# Patient Record
Sex: Male | Born: 1955 | Race: White | Hispanic: No | Marital: Married | Smoking: Current every day smoker
Health system: Southern US, Community
[De-identification: ages and names within clinical notes are randomized; demographics above are authoritative.]

## PROBLEM LIST (undated history)

## (undated) DIAGNOSIS — E782 Mixed hyperlipidemia: Secondary | ICD-10-CM

## (undated) DIAGNOSIS — K219 Gastro-esophageal reflux disease without esophagitis: Secondary | ICD-10-CM

## (undated) DIAGNOSIS — J45909 Unspecified asthma, uncomplicated: Secondary | ICD-10-CM

## (undated) DIAGNOSIS — Z720 Tobacco use: Secondary | ICD-10-CM

## (undated) DIAGNOSIS — G8929 Other chronic pain: Secondary | ICD-10-CM

## (undated) DIAGNOSIS — J449 Chronic obstructive pulmonary disease, unspecified: Secondary | ICD-10-CM

## (undated) DIAGNOSIS — R011 Cardiac murmur, unspecified: Secondary | ICD-10-CM

## (undated) HISTORY — PX: BACK SURGERY: SHX140

## (undated) HISTORY — DX: Mixed hyperlipidemia: E78.2

## (undated) HISTORY — DX: Other chronic pain: G89.29

## (undated) HISTORY — DX: Cardiac murmur, unspecified: R01.1

## (undated) HISTORY — DX: Tobacco use: Z72.0

## (undated) HISTORY — DX: Gastro-esophageal reflux disease without esophagitis: K21.9

---

## 2001-09-09 ENCOUNTER — Encounter: Payer: Self-pay | Admitting: *Deleted

## 2001-09-09 ENCOUNTER — Ambulatory Visit (HOSPITAL_COMMUNITY): Admission: RE | Admit: 2001-09-09 | Discharge: 2001-09-09 | Payer: Self-pay | Admitting: *Deleted

## 2002-05-22 ENCOUNTER — Emergency Department (HOSPITAL_COMMUNITY): Admission: EM | Admit: 2002-05-22 | Discharge: 2002-05-22 | Payer: Self-pay | Admitting: Internal Medicine

## 2002-05-22 ENCOUNTER — Encounter: Payer: Self-pay | Admitting: Internal Medicine

## 2002-06-11 ENCOUNTER — Encounter: Payer: Self-pay | Admitting: Orthopaedic Surgery

## 2002-06-11 ENCOUNTER — Encounter (HOSPITAL_COMMUNITY): Admission: RE | Admit: 2002-06-11 | Discharge: 2002-07-11 | Payer: Self-pay | Admitting: Orthopaedic Surgery

## 2002-07-20 ENCOUNTER — Encounter: Payer: Self-pay | Admitting: Internal Medicine

## 2002-07-20 ENCOUNTER — Ambulatory Visit (HOSPITAL_COMMUNITY): Admission: RE | Admit: 2002-07-20 | Discharge: 2002-07-20 | Payer: Self-pay | Admitting: Internal Medicine

## 2002-09-14 ENCOUNTER — Encounter: Payer: Self-pay | Admitting: *Deleted

## 2002-09-14 ENCOUNTER — Ambulatory Visit (HOSPITAL_COMMUNITY): Admission: RE | Admit: 2002-09-14 | Discharge: 2002-09-14 | Payer: Self-pay | Admitting: *Deleted

## 2002-10-05 ENCOUNTER — Encounter: Payer: Self-pay | Admitting: *Deleted

## 2002-10-05 ENCOUNTER — Ambulatory Visit (HOSPITAL_COMMUNITY): Admission: RE | Admit: 2002-10-05 | Discharge: 2002-10-05 | Payer: Self-pay | Admitting: *Deleted

## 2002-10-08 ENCOUNTER — Inpatient Hospital Stay (HOSPITAL_COMMUNITY): Admission: RE | Admit: 2002-10-08 | Discharge: 2002-10-09 | Payer: Self-pay | Admitting: *Deleted

## 2002-10-08 ENCOUNTER — Encounter: Payer: Self-pay | Admitting: *Deleted

## 2002-11-22 ENCOUNTER — Ambulatory Visit (HOSPITAL_COMMUNITY): Admission: RE | Admit: 2002-11-22 | Discharge: 2002-11-22 | Payer: Self-pay | Admitting: Orthopedic Surgery

## 2002-11-22 ENCOUNTER — Encounter: Payer: Self-pay | Admitting: Orthopedic Surgery

## 2003-03-02 ENCOUNTER — Ambulatory Visit (HOSPITAL_COMMUNITY): Admission: RE | Admit: 2003-03-02 | Discharge: 2003-03-02 | Payer: Self-pay | Admitting: *Deleted

## 2003-05-11 ENCOUNTER — Ambulatory Visit (HOSPITAL_COMMUNITY): Admission: RE | Admit: 2003-05-11 | Discharge: 2003-05-11 | Payer: Self-pay | Admitting: *Deleted

## 2003-06-02 ENCOUNTER — Encounter: Admission: RE | Admit: 2003-06-02 | Discharge: 2003-06-02 | Payer: Self-pay | Admitting: *Deleted

## 2003-06-15 ENCOUNTER — Ambulatory Visit (HOSPITAL_COMMUNITY): Admission: RE | Admit: 2003-06-15 | Discharge: 2003-06-15 | Payer: Self-pay | Admitting: *Deleted

## 2004-02-10 ENCOUNTER — Ambulatory Visit (HOSPITAL_COMMUNITY): Admission: RE | Admit: 2004-02-10 | Discharge: 2004-02-10 | Payer: Self-pay | Admitting: Internal Medicine

## 2004-02-21 ENCOUNTER — Ambulatory Visit: Payer: Self-pay | Admitting: Physician Assistant

## 2004-02-28 ENCOUNTER — Ambulatory Visit: Payer: Self-pay | Admitting: Pain Medicine

## 2004-03-15 ENCOUNTER — Emergency Department: Payer: Self-pay | Admitting: Emergency Medicine

## 2004-03-15 ENCOUNTER — Ambulatory Visit: Payer: Self-pay | Admitting: Physician Assistant

## 2004-04-04 ENCOUNTER — Ambulatory Visit (HOSPITAL_COMMUNITY): Payer: Self-pay | Admitting: Psychiatry

## 2004-07-06 ENCOUNTER — Emergency Department: Payer: Self-pay | Admitting: Emergency Medicine

## 2005-05-06 HISTORY — PX: ESOPHAGOGASTRODUODENOSCOPY: SHX1529

## 2006-01-03 ENCOUNTER — Ambulatory Visit (HOSPITAL_COMMUNITY): Admission: RE | Admit: 2006-01-03 | Discharge: 2006-01-03 | Payer: Self-pay | Admitting: Family Medicine

## 2006-01-10 ENCOUNTER — Ambulatory Visit (HOSPITAL_COMMUNITY): Admission: RE | Admit: 2006-01-10 | Discharge: 2006-01-10 | Payer: Self-pay | Admitting: Family Medicine

## 2006-01-14 ENCOUNTER — Ambulatory Visit (HOSPITAL_COMMUNITY): Admission: RE | Admit: 2006-01-14 | Discharge: 2006-01-14 | Payer: Self-pay | Admitting: Family Medicine

## 2006-02-26 ENCOUNTER — Ambulatory Visit: Payer: Self-pay | Admitting: Internal Medicine

## 2006-03-10 ENCOUNTER — Ambulatory Visit (HOSPITAL_COMMUNITY): Admission: RE | Admit: 2006-03-10 | Discharge: 2006-03-10 | Payer: Self-pay | Admitting: Internal Medicine

## 2006-03-10 ENCOUNTER — Ambulatory Visit: Payer: Self-pay | Admitting: Internal Medicine

## 2007-05-07 HISTORY — PX: COLONOSCOPY: SHX174

## 2007-05-29 ENCOUNTER — Ambulatory Visit (HOSPITAL_COMMUNITY): Admission: RE | Admit: 2007-05-29 | Discharge: 2007-05-29 | Payer: Self-pay | Admitting: Family Medicine

## 2007-06-29 ENCOUNTER — Ambulatory Visit (HOSPITAL_COMMUNITY): Admission: RE | Admit: 2007-06-29 | Discharge: 2007-06-29 | Payer: Self-pay | Admitting: Family Medicine

## 2008-01-28 ENCOUNTER — Ambulatory Visit (HOSPITAL_COMMUNITY): Admission: RE | Admit: 2008-01-28 | Discharge: 2008-01-28 | Payer: Self-pay | Admitting: Family Medicine

## 2008-02-04 ENCOUNTER — Ambulatory Visit (HOSPITAL_COMMUNITY): Admission: RE | Admit: 2008-02-04 | Discharge: 2008-02-04 | Payer: Self-pay | Admitting: Family Medicine

## 2008-02-10 ENCOUNTER — Ambulatory Visit (HOSPITAL_COMMUNITY): Admission: RE | Admit: 2008-02-10 | Discharge: 2008-02-10 | Payer: Self-pay | Admitting: Family Medicine

## 2008-02-23 ENCOUNTER — Ambulatory Visit (HOSPITAL_COMMUNITY): Admission: RE | Admit: 2008-02-23 | Discharge: 2008-02-23 | Payer: Self-pay | Admitting: Gastroenterology

## 2008-02-23 ENCOUNTER — Ambulatory Visit: Payer: Self-pay | Admitting: Gastroenterology

## 2008-03-30 ENCOUNTER — Ambulatory Visit (HOSPITAL_COMMUNITY): Admission: RE | Admit: 2008-03-30 | Discharge: 2008-03-30 | Payer: Self-pay | Admitting: Internal Medicine

## 2008-08-01 ENCOUNTER — Emergency Department (HOSPITAL_COMMUNITY): Admission: EM | Admit: 2008-08-01 | Discharge: 2008-08-01 | Payer: Self-pay | Admitting: Emergency Medicine

## 2010-05-28 ENCOUNTER — Encounter: Payer: Self-pay | Admitting: Internal Medicine

## 2010-08-16 LAB — COMPREHENSIVE METABOLIC PANEL
ALT: 38 U/L (ref 0–53)
Albumin: 4.5 g/dL (ref 3.5–5.2)
Alkaline Phosphatase: 172 U/L — ABNORMAL HIGH (ref 39–117)
Chloride: 102 mEq/L (ref 96–112)
Glucose, Bld: 156 mg/dL — ABNORMAL HIGH (ref 70–99)
Potassium: 4.1 mEq/L (ref 3.5–5.1)
Sodium: 138 mEq/L (ref 135–145)
Total Bilirubin: 0.6 mg/dL (ref 0.3–1.2)
Total Protein: 8 g/dL (ref 6.0–8.3)

## 2010-08-16 LAB — DIFFERENTIAL
Eosinophils Absolute: 0.2 10*3/uL (ref 0.0–0.7)
Eosinophils Relative: 2 % (ref 0–5)
Lymphs Abs: 2.1 10*3/uL (ref 0.7–4.0)
Monocytes Relative: 7 % (ref 3–12)

## 2010-08-16 LAB — URINALYSIS, ROUTINE W REFLEX MICROSCOPIC
Bilirubin Urine: NEGATIVE
Hgb urine dipstick: NEGATIVE
Nitrite: NEGATIVE
Specific Gravity, Urine: >1.03 — ABNORMAL HIGH (ref 1.005–1.030)
Urobilinogen, UA: 0.2 mg/dL (ref 0.0–1.0)

## 2010-08-16 LAB — URINE MICROSCOPIC-ADD ON

## 2010-08-16 LAB — URINE CULTURE: Colony Count: 55000

## 2010-08-16 LAB — CBC
MCHC: 33.9 g/dL (ref 30.0–36.0)
RBC: 5.93 MIL/uL — ABNORMAL HIGH (ref 4.22–5.81)
RDW: 14.2 % (ref 11.5–15.5)

## 2010-09-18 NOTE — Op Note (Signed)
NAME:  Jamie Lam, Jamie Lam               ACCOUNT NO.:  [id number]   MEDICAL RECORD NO.:  [id number]          PATIENT TYPE:  AMB   LOCATION:  DAY                           FACILITY:  [facility]   PHYSICIAN:  Kassie Mends, M.D.      DATE OF BIRTH:  1956-02-18   DATE OF PROCEDURE:  02/23/2008  DATE OF DISCHARGE:                               OPERATIVE REPORT   REFERRING Tucker Steedley:  Earma Reading PA-C   PROCEDURE:  Colonoscopy.   INDICATION FOR EXAM:  Mr. Pineo is a 55 year old male who presents for  average risk colon cancer screening.   FINDINGS:  1. Rare sigmoid colon diverticula.  Otherwise, no polyps, masses,      inflammatory changes, or AVMs seen.  2. Normal retroflexed view of the rectum.   RECOMMENDATIONS:  1. Screening colonoscopy in 10 years.  2. He should follow high-fiber diet.  He is given a handout on high-      fiber diet, constipation, and diverticulosis.   MEDICATIONS:  1. Demerol 100 mg IV.  2. Versed 5 mg IV.   PROCEDURE TECHNIQUE:  Physical exam was performed.  Informed consent was  obtained from the patient after explaining the benefits, risks, and  alternatives to the procedure.  The patient was connected to the monitor  and placed in a left lateral position.  Continuous oxygen was provided  by nasal cannula and IV medicine administered through an indwelling  cannula.  After administration of sedation and rectal exam, the  patient's rectum was intubated and scope was advanced under direct  visualization to the cecum.  The scope was removed slowly by carefully  examining the color, texture, anatomy, and integrity of the mucosa on  the way out.  The patient was recovered in endoscopy and discharged home  in satisfactory condition.      Kassie Mends, M.D.  Electronically Signed    SM/MEDQ  D:  02/23/2008  T:  02/23/2008  Job:  [id number]   cc:   Kirk Ruths, M.D.  Fax: [phone]

## 2010-09-18 NOTE — Procedures (Signed)
NAME:  NOLYN, SWAB               ACCOUNT NO.:  [id number]   MEDICAL RECORD NO.:  [id number]          PATIENT TYPE:  OUT   LOCATION:  RESP                          FACILITY:  [facility]   PHYSICIAN:  Edward L. Juanetta Gosling, M.D.DATE OF BIRTH:  1956/01/14   DATE OF PROCEDURE:  DATE OF DISCHARGE:                            PULMONARY FUNCTION TEST   Mr. Troiani is a patient of Dr. Regino Schultze.   Spirometry shows no ventilatory defect and evidence of airflow  obstruction mostly at the level of the smaller airways.   Lung volumes are normal.   Diffusing capacity of lung for carbon monoxide is normal.   Arterial blood gases are normal.      Edward L. Juanetta Gosling, M.D.  Electronically Signed     ELH/MEDQ  D:  02/11/2008  T:  02/12/2008  Job:  [id number]   cc:   Kirk Ruths, M.D.  Fax: [phone]

## 2010-09-21 NOTE — Op Note (Signed)
[facility]  Patient:    Jamie Lam, TRAUM Visit Number: [id number] MRN: [id number]          Service Type: Attending:  Donnal Debar. Gasper Sells, M.D. Dictated by:   Donnal Debar Gasper Sells, M.D. Proc. Date: 09/09/01 Adm. Date:  09/09/01                             Operative Report  PROCEDURE:  Bilateral sacroiliac joint block.  DESCRIPTION OF PROCEDURE:  With the patient in the prone position the patient was prepped and draped for a bilateral SI joint by prepping the buttocks bilaterally with Betadine.  Then 22 gauge needles were inserted down to the junction of the SI joint with the pelvis in the AP projection.  Poking these joints particularly on the left recreated this problem.  The joints were injected with a total of 3 cc. in each side in 1 cc. aliquots with aspiration before each injection of 4 cc. of 25% bupivacaine without epinephrine and 2 cc. of Depo-Medrol.  This relieved his SI joint pain and his back pain and leg pain 100% more or less almost immediately.  Based on this one could conclude the bulk of his pain is coming from his SI joints. Dictated by:   Donnal Debar Gasper Sells, M.D. Attending:  Donnal Debar. Gasper Sells, M.D. DD:  09/09/01 TD:  09/09/01 Job: [id number]

## 2010-09-21 NOTE — Consult Note (Signed)
NAME:  Jamie Lam, Jamie Lam               ACCOUNT NO.:  [id number]   MEDICAL RECORD NO.:  [id number]          PATIENT TYPE:  AMB   LOCATION:  DAY                           FACILITY:  [facility]   PHYSICIAN:  Lionel December, M.D.    DATE OF BIRTH:  1956-02-06   DATE OF CONSULTATION:  DATE OF DISCHARGE:                                   CONSULTATION   REQUESTING PHYSICIAN:  Dr. Patrica Duel.   REASON FOR CONSULTATION:  Positive H pylori/chronic nausea and vomiting.   HISTORY OF PRESENT ILLNESS:  Jamie Lam is a 55 year old Caucasian male  referred to the courtesy of Dr. Geanie Logan office.  He tells me he has had  chronic nausea and vomiting.  Eating definitely makes his symptoms worse.  He also complains of bilateral stabbing-type abdominal pain into his upper  abdomen.  It usually lasts about for 1-4 hours when he gets pain.  He rates  the pain 6/10 on pain scale.  There is no particular time a day that he gets  a pain.  He states he has not felt well since January when he was sick due  to consuming peanut butter.  He has had abdominal bloating since that time.  He also complains of fatigue.  He is having 4-5 bowel movements a day.  Denies any diarrhea, constipation.  Denies any rectal bleeding or melena.  Denies any heartburn or indigestion.  Denies any early satiety. but does  have anorexia.  He is only consuming one to two meals a day because he is  afraid of having abdominal pain and bloating.  He denies any dysphagia or  odynophagia.  He had an abdominal ultrasound on January 10, 2006.  There  was mild extrahepatic biliary dilatation for his age.  Therefore, this was  followed by a CT with and without contrast.  The hepatobiliary system  appeared normal on CT.  There were few small renal calculi which were  nonobstructive, and atherosclerotic disease, but no aneurysm.  He had a  negative hepatitis B surface antigen and core antibody, hepatitis A antibody  and C antibody.  He also had an  elevated white blood cell count of 13.7,  normal hemoglobin, hematocrit and a platelet count of 432.  He had an  elevated alkaline phosphatase of 187 and elevated glucose of 130, otherwise,  normal LFTs and comprehensive metabolic panel.  He had an amylase of 19,  lipase of 14 which are normal, and he was found to be H.  Pylori positive at  3.4.  He does not believe he has been treated for this yet.   PAST MEDICAL HISTORY:  1. Asthma.  2. Chronic back pain and surgery in 2004.  3. Depression.   CURRENT MEDICATIONS:  1. Lunesta q.h.s.  2. Cymbalta two p.o. at q.a.m.  3. Gabapentin 4 daily.  4. Skelaxin 2-3 daily.  5. tramadol q. 4-6 h p.r.n.  6. Vitamin B12 once daily.  7. He takes mini ephedrine three times a day p.r.n.   ALLERGIES:  NO KNOWN DRUG ALLERGIES.   FAMILY HISTORY:  There is no known  family history colorectal carcinoma,  liver or chronic GI problems.  Father deceased at age 55 secondary lymphoma.  Mother age 25, has history of asthma.  He has three healthy siblings.   SOCIAL HISTORY:  Mr. Goodall has been married for 19 years.  He has one grown  healthy child.  He is disabled.  He has a 32 pack year history of tobacco  use.  He has a heavy alcohol use history for about 8 years.  He has not  consumed any alcohol in the last 9 years.  Denies any drug use.   REVIEW OF SYSTEMS:  CONSTITUTIONAL:  He has complained of some low grade  temperature as well as chills.  He complains of fatigue.  CARDIOVASCULAR:  Denies any chest pain or palpitation.  PULMONARY:  No shortness of breath,  dyspnea, cough or hemoptysis.  GI:  See HPI.   PHYSICAL EXAMINATION:  VITAL SIGNS:  Weight 147 pounds, height 67 inches,  temperature 98.1, blood pressure 118/68 and pulse 92.  GENERAL:  Jamie Lam a 55 year old thin Caucasian male who is alert and  pleasant, cooperative in no acute distress.  HEENT:  Scler are clear.  Nonicteric.  Conjunctivae are pink.  Oropharynx  pink, moist without  lesions.  NECK:  Supple without mass or thyromegaly.  CHEST:  Regular rate and rhythm.  Normal S1, S2.  Without murmurs, rubs,  clicks or gallops.  LUNGS:  Clear to auscultation bilaterally, but he does have decreased breath  sounds bilaterally.  ABDOMEN:  Positive bowel sounds x4.  No bruits auscultated.  Soft,  nontender, nondistended without palpable mass, or hepatosplenomegaly.  No  rebound tenderness or guarding.  EXTREMITIES:  Without clubbing or edema bilaterally.  SKIN:  Pink, warm and dry without any rash or jaundice.   ASSESSMENT:  Jamie Lam is a 55 year old Caucasian male with a 57-month  history of chronic nausea and vomiting along with upper abdominal pain which  generally lasts between 1-4 hours with each episode.  Pain is usually worse  postprandially.  He is also having anorexia and only consumes about 1-2  meals per day.  He was recently found to be H.  Pylori positive.  He also  complains of significant bloating and fatigue.  He is going to require  further evaluation of his upper GI tract to rule out peptic ulcer disease  and H. Pylori gastritis.   PLAN:  1. EGD with Dr. Karilyn Cota in the near future.  I have discussed this      procedure including risks and benefits including but not limited to      bleeding, infection, perforation, drug reaction.  He agrees to plan and      consent will be obtained.  I would like to thank Dr. Nobie Putnam for      allowing [place] to participate in the care of Mr. Oregel.      Nicholas Lose, N.P.      Lionel December, M.D.  Electronically Signed    KC/MEDQ  D:  02/26/2006  T:  02/27/2006  Job:  [id number]   cc:   Patrica Duel, M.D.  Fax: [phone]

## 2010-09-21 NOTE — Op Note (Signed)
NAME:  Jamie Lam, Jamie Lam                         ACCOUNT NO.:  [id number]   MEDICAL RECORD NO.:  [id number]                   PATIENT TYPE:  OUT   LOCATION:  XRAY                                 FACILITY:  [facility]   PHYSICIAN:  Ricky D. Gasper Sells, M.D.             DATE OF BIRTH:  03-02-1956   DATE OF PROCEDURE:  DATE OF DISCHARGE:                                 OPERATIVE REPORT   PREOPERATIVE DIAGNOSES:  1. Bilateral sacroiliac joint-related pain.  2. Herniated nucleus pulposus/degenerative disk disease, lumbar 3-4, well-     documented.   POSTOPERATIVE DIAGNOSES:  1. Bilateral sacroiliac joint-related pain.  2. Herniated nucleus pulposus/degenerative disk disease, lumbar 3-4, well-     documented.   OPERATION PERFORMED:  Left sacroiliac joint injection with local anesthetic  and steroids under fluoroscopic control.   SURGEON:  Ricky D. Gasper Sells, M.D.   SUMMARY:  This is a 56 year old gentleman I saw in my office a year ago with  SI joint-related pain.  He had a grossly abnormal MRI, but that was the  source of his discomfort.  I took him to [facility] and injected  both his SI joints under fluoroscopic control, and that knocked his pain out  for eight hours.  I was under the understanding that the [facility] in  [place] did SI joint radiofrequency coagulation to find they did not.  He  went back to them for treatment, they were not able to do anything, so he  came back to see Dr. Newell Coral.  Dr. Jule Ser evaluated him, find out that he  had seen me and sent him back to me.   Essentially his picture is identically the same.  His SI joint examination  was grossly abnormal, moreso on the right than the left.  His straight-leg  raises were negative.  His femoral nerve stretches were negative.  His hip  examinations were negative and neurologically he was intact.  Based on this  I decided to block his SI joints.   DESCRIPTION OF PROCEDURE:  With the patient in the  prone position the SI  joints were prepped with Betadine solution.  Starting out on the left side a  22-gauge needle was inserted down to the junction of the SI joint and the  cavity of the pelvis in the AP projection.  Poking that joint recreated his  usual left-sided discomfort.  The joint was  then blocked with a total of  3 mL of a solution of 2 mL of 25% bupivacaine  and 1 mL of Depo-Medrol in half milliliter aliquot of aspiration for each  injection.  This completely relieved his left-sided discomfort.   The patient tolerated the procedure well.  Ricky D. Gasper Sells, M.D.    RDH/MEDQ  D:  09/14/2002  T:  09/14/2002  Job:  [id number]   cc:   Hewitt Shorts, M.D.  [address]  [place]  [place] [address]  Fax: [phone]

## 2010-09-21 NOTE — Op Note (Signed)
NAME:  Jamie Lam, Jamie Lam               ACCOUNT NO.:  [id number]   MEDICAL RECORD NO.:  [id number]          PATIENT TYPE:  AMB   LOCATION:  DAY                           FACILITY:  [facility]   PHYSICIAN:  Lionel December, M.D.    DATE OF BIRTH:  1955-06-25   DATE OF PROCEDURE:  03/10/2006  DATE OF DISCHARGE:                                 OPERATIVE REPORT   PROCEDURE:  Esophagogastroduodenoscopy.   INDICATIONS:  Jamie Lam is a 55 year old Caucasian male with history of chronic  back pain, depression, and asthma, who has been experiencing nausea and  vomiting of 10 months' duration with intermittent upper abdominal pain.  On  workup, he was noted to have mildly elevated alkaline phosphatase and mildly  dilated CBD.  However, CT was negative.  His Helicobacter pylori serology is  positive, but treatment has been put on hold because of his symptomatology,  and he is to be treated.   Procedure risks were reviewed with the patient. Informed consent was  obtained.   MEDICATIONS FOR CONSCIOUS SEDATION:  1. Benzocaine spray for pharyngeal topical anesthesia.  2. Demerol 50 mg IV.  3. Versed 8 mg IV.   FINDINGS:  Procedure performed in endoscopy suite.  The patient's vital  signs and O2 saturation were monitored during the procedure and remained  stable.  The patient was placed in the left lateral recumbent position.  The  Olympus videoscope was passed through the oropharynx without any difficulty  into the esophagus.   Esophagus:  Mucosa of the esophagus normal.  GE junction was wavy, located  at 40 cm from the incisors.  No hernia was noted.   Stomach:  It was empty and distended very well with insufflation.  Folds of  the proximal stomach were normal.  Examination of mucosa revealed erythema,  some mucosal granularity at the antrum, but no erosions or ulcers are noted.  Pyloric channel was patent.  Angularis, fundus, and cardia examined by  retroflexing the scope and were normal.   Duodenum:   Bulbar mucosa was normal.  The scope was passed into the second  part of the duodenum, where mucosa and folds were normal.  Endoscope was  withdrawn.  The patient tolerated the procedure well.   FINAL DIAGNOSIS:  Nonerosive antral gastritis secondary to Helicobacter  pylori infection.  No evidence of peptic ulcer disease or any finding that  would explain the patient's symptomatology.   PLAN:  1. LFTs will be repeated today.  2. We will attempt to treat his Helicobacter pylori infection with a      Prevpac although we may have to stop therapy      should he develop side effects or more nausea and vomiting.  3. If the patient's AP remains elevated, ultrasound would be repeated to      make sure dilation of CBD is not progressive.  He may eventually also      need gastric emptying study.      Lionel December, M.D.  Electronically Signed     NR/MEDQ  D:  03/10/2006  T:  03/11/2006  Job:  [id number]  cc:   Patrica Duel, M.D.  Fax: [phone]

## 2010-09-21 NOTE — Op Note (Signed)
NAME:  Jamie Lam, Jamie Lam                         ACCOUNT NO.:  [id number]   MEDICAL RECORD NO.:  [id number]                   PATIENT TYPE:  OUT   LOCATION:  XRAY                                 FACILITY:  [facility]   PHYSICIAN:  Ricky D. Gasper Sells, M.D.             DATE OF BIRTH:  [birth year]   DATE OF PROCEDURE:  09/14/2002  DATE OF DISCHARGE:                                 OPERATIVE REPORT   PROCEDURE:  Right sacroiliac joint injection using a 22 gauge needle under  fluoroscopic control.   SURGEON:  Ricky D. Gasper Sells, M.D.   ASSISTANT:  None.   COMPLICATIONS:  Nil.   SUMMARY:  This is a 55 year old gentleman with well-documented SI joint  dysfunction/pain.  He had MRI's x2 which showed severe degenerative disk  disease and HNP.  Unfortunately, or fortunately, that is not the source of  his problems.  His SI joint provocative tests were all positive, especially  on the right side. His neurological was unremarkable, straight leg raising  was negative, femoral nerve stretch was negative, strength in his lower  extremities was negative.  He had had a successful SI joint in the past. He  saw Dr. Newell Coral and then Dr. Newell Coral referred him back to me, after he  heard the story.   DESCRIPTION OF PROCEDURE:  With the patient in the prone position, the  patient's buttocks were prepped with Betadine solution. The right SI joint  was identified on AP fluoroscopy and a 22 gauge needle was inserted down to  the junction of the SI joint with the pelvis.  Poking this joint recreated  his discomfort.  The joint was then anesthetized with  a total of 3 cc of a solution of 2 cc of 0.5% bupivacaine and 1 cc of Depo-  Medrol.  This was done in 0.5 cc aliquots with aspiration after each  injection.  This completely knocked out his right-sided discomfort.   The patient was then discharged home after being measured for an Aspen LSO  brace, with the SI joint present, by Mr. Carole Civil.  He will be  braced,  placed on a special physio-program for SI joints, which I am working on  with an M.D. chiropractor, and hopefully, we will be able to give these  people relief. The next step in the process is to do radiofrequency  coagulation of these joints. Hopefully, that will prove useful in this  difficult to manage problem.                                                 Ricky D. Gasper Sells, M.D.    RDH/MEDQ  D:  09/14/2002  T:  09/14/2002  Job:  [id number]   cc:   Hewitt Shorts, M.D.  [address]  [place]  [place] [address]  Fax: [phone]

## 2010-09-21 NOTE — Op Note (Signed)
NAME:  Jamie Lam, Jamie Lam                         ACCOUNT NO.:  [id number]   MEDICAL RECORD NO.:  [id number]                   PATIENT TYPE:  INP   LOCATION:  3011                                 FACILITY:  [facility]   PHYSICIAN:  Ricky D. Gasper Sells, M.D.             DATE OF BIRTH:  1955-05-17   DATE OF PROCEDURE:  DATE OF DISCHARGE:                                 OPERATIVE REPORT   PREOPERATIVE DIAGNOSES:  1. 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  2. Sacroiliac joint related pain right sided.   POSTOPERATIVE DIAGNOSES:  1. 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  2. Sacroiliac joint related pain right sided.   OPERATION:  1. Bilateral microsurgical L3-4 lumbar laminectomy with decompression of the     L4 nerve roots bilaterally and diskectomies.  2. Harvesting of bone from the laminectomy for later Autograft fusion.  3. Right posterior lateral fusion using Allometric mixed with Autograft as     previously described from the laminectomies and bone marrow mixed with     blood.   SURGEON:  Ricky D. Gasper Sells, M.D.   ASSISTANT:  Annie Sable, M.D.   ANESTHESIA:  General controlled   COMPLICATIONS:  Nil   ESTIMATED BLOOD LOSS:  Less than 200 cc   INDICATIONS FOR PROCEDURE:  This is a 55 year old male with a year and a  half history of low back pain and right greater than left leg pain related  to activity.  I saw him about a year prior to this operation and at that  time, injected his right sacroiliac joint.  He had obtained modest relief  with this and went to a pain clinic rather than undergoing surgery.  They  treated him with epidural steroids and conservatively for the next year and  eventually he was unable to go on.  I did a myelogram on him earlier this  week and it showed multifactorial stenosis at L3-4 very severe with  inconsequential bulges at other levels.  At the  L3-4 level, there was a  ruptured disc concentric to the right mixed with lateral recessed stenosis,  facet hypertrophy and thickened ligamentum flavum into a congenitally small  canal.  A flexion and extension view was done at the time of the myelogram  and there was no demonstrable instability or abnormal movement.   It was elected to do a simple decompression on him and at the time of  surgery to decide if he needs any sort of buttressing.   This plays into what in [place] is a controversy and what in the rest of the  world is not and that is who gets a fusion and what sort of fusion do they  get.  Standard treatment, but that is not to say standard care, because it  is not a standard, is to instrument and fuse  practically everybody that  comes through the door.   The logic behind this is a certain percentage of patients with this sort of  problem go on to develop instability later on.  The classic number quoted is  20%; however, it is probably lower with the keyhole type laminotomies that  are done today.  These are figures that date from the 92s and 60s.   Usual in [place], which probably relates to remuneration more than anything,  is that an instrumented fusion of some type is done usually a T-Lift or  posterior lumbar interbody fusion with pedical screws.   Statistically there is a higher fusion rate with these cases, there is no  question but the complication rate if higher and the infection rate is 600%  of the instrumented cases in posterior fusions and that is a conservative  estimate.   An intermediate step and this has been suggested a number of times by Lesle Chris who is the dean of spinal neurosurgery in [place], more or less, is  that one simply takes the bone that is removed at the time of the  laminectomy and place it back laterally in hopes of a fibrous fusion or some  buttressing down the line decreasing the chance of the much more morbid  instrumented  operation.   The alternative is to simply do the laminectomy and let them go, that is  also quite acceptable.   The former is what was carried out in this man's case without difficulty or  complication.  The patient tolerated the procedure well and awoke from  surgery without his usual left leg pain and certainly overall improved from  the preoperative condition.   Intraoperatively and eccentric to the right subligamentous disc herniation  was found in addition to thickened ligamentum flavum, hypertrophied facets  and tight lateral recesses.  All of these were decompressed.   DESCRIPTION OF PROCEDURE:  With the patient in the prone position, the  patient was prepped and draped in the usual fashion for a lumbar  laminectomy.  All pressure points were carefully inspected and padded.  The  patient was placed in PAS hose and a bear hugger blanket and at that point,  a midline incision was made centered over what appeared to be L3-4 level.  The level was confirmed with an intraoperative control x-ray.  Incision was  adjusted 1 cm superiorly to accommodate the necessary exposure.  Paraspinal  muscles and ligaments were dissected free of the spinous process and lamina  of L3 and L4 bilaterally.  Carrying the dissection lateral to the facets of  L3-4 bilaterally out to the transverse process of the L3 and L4 on the right  side.  Self retaining retractors were placed.   Using a Leksell instrument, generous laminotomies of L3 were carried out  inferiorly bilaterally and the bone was saved for later fusion.  A high  speed drill was used to trim up the inferior laminotomy of L3 and perform a  medial facetectomy of L3-4 bilaterally since that bone was useless for  fusion anyway and because of the presence of cartilage.   Starting out on the left side, a 2 mm Kerrison punch was used to trim up the medial facetectomy of L3-4 and then the ligamentum flavum was removed in a  step-wise fashion  revealing the underlying dura and the L4 nerve root.  Nerve hook was used to dissect this free of the underlying bulging L3-4 disc  and then dura and  L4 nerve root were retracted medially which revealed the  L3-4 disc.  This was incised in a trapezoidal fashion with a #15 blade  knife.  The disc was then removed with straight biting, upbiting and  downbiting pituitary rongeurs.  Curetted with the medium bent ring curet as  well as an Epstein curet and re-evacuated with the same instruments.  Epidural hemostasis was achieved with bipolar coagulation and when that  failed, Gelfoam under gentle pressure.   The epidural space was palpated and found to be clear on the left side and  the foramen of L4 on the left was found to be well opened as well as the  lateral recess.   Exactly the same procedure was carried out on the right side.  The disc was  somewhat more bulging on the right and there was more subligamentous  component.  This was removed without difficulty in exactly the same fashion  as on the left side.   Attention was then directed lateral to the L3-4 facet on the right side.  The L3 transverse process as well as the L4 was dissected free of his  muscular ligamentous attachments and then a high speed drill was used to  decorticate the transverse process as well as the lateral aspect of the L3-4  facet joint and then a purse of the patient's own bone from the laminotomy  was mixed with Allometric powder, 3.2 cc of Vancomycin solution from the  perioperative antibiotic and a mixture of the patient's own bone marrow and  blood.  This turned into a nice firm paste.  It was then wrapped into a  purse of Surgicel and this was placed laterally basically to hold it in  place.   The self retaining retractors were then removed and the wound was irrigated  with Betadine solution, then hydrogen peroxide solution.   Control x-ray at that time showed a retained sponge in the paraspinal   muscles.  This was removed uneventfully and finally at time of closure the  instrument count, sponge count and needle count was correct.   The dorsal lumbar fascia was closed with 2-0 Vicryl in interrupted fashion.  Then 5 cc of Vancomycin solution was squirted into the subfascial space.  The subcutaneous closure was carried out with 2-0 Vicryl inverted  interrupted.  Skin staples were used in the skin which had been anesthetized  with a total of 10 cc of 0.5% Bupivacaine without epinephrine.  Betadine  ointment and dry dressing was applied.  The patient left the operating room  in good condition, moving all four extremities and was placed in his Aspen  brace in the recovery room.   No intraoperative complications occurred.  Instrument count, sponge count  and needle count was correct.                                                 Ricky D. Gasper Sells, M.D.   RDH/MEDQ  D:  10/09/2002  T:  10/09/2002  Job:  [id number]   cc:   Dept of Neurosurgery, [facility]  [address]  [address]  [place] [address]

## 2010-09-21 NOTE — Discharge Summary (Signed)
NAME:  Jamie Lam, Jamie Lam                         ACCOUNT NO.:  [id number]   MEDICAL RECORD NO.:  [id number]                   PATIENT TYPE:  INP   LOCATION:  3011                                 FACILITY:  [facility]   PHYSICIAN:  Ricky D. Gasper Sells, M.D.             DATE OF BIRTH:  09-Oct-1955   DATE OF ADMISSION:  10/08/2002  DATE OF DISCHARGE:  10/09/2002                                 DISCHARGE SUMMARY   ADMISSION DIAGNOSES:  1. Spinal stenosis L3-4 bilateral without evidence of instability.  2. Right sacroiliac joint dysfunction.   DISCHARGE DIAGNOSES:  1. Spinal stenosis L3-4 bilateral without evidence of instability.  2. Right sacroiliac joint dysfunction.   SURGERY:  1. Bilateral L3-4 microlaminectomies, decompression, and diskectomies.  2. Harvesting of bone from same incision from the laminas for later fusion.  3. Right posterolateral fusion using AlloMatrix bone marrow and blood mixed     with autograft and antibiotic solution.   SURGEON:  Ricky D. Gasper Sells, M.D.   ASSISTANT:  Romie Jumper, M.D.   COMPLICATIONS:  Nil.   ESTIMATED BLOOD LOSS:  Less than 200 mL.   SUMMARY:  This is a 55 year old male that presented with bilateral L3-4  myelographic-proven spinal stenosis with no evidence of translocation on  flexion and extension views.  He had failed approximately a year of  conservative therapy.  His presentation this time was of severe pain  unresponsive to SI joint block, incapacitating related to activity.  Myelogram proved the severe spinal stenosis at the L3-4 level.  For that  reason he was booked for surgery.  He underwent a bilateral L3-4  laminectomy.  The bone that had been taken from the laminectomy was just  placed laterally at the L3-4 level.  The rationale behind this is that if it  fuses, fine; if it develops a fibrous union to buttress the level, even  better.  Certainly, this does not fit any internationally acceptable  criteria for instrumentation.   On the other hand, there is a percentage of  these patients that go on to develop instability.  Hopefully, this minor  addition to this procedure will prevent that or decrease the likelihood of  that.   The patient tolerated the procedure well.  He had some incisional pain  immediately postoperatively.  He was up walking normally in the hall,  voiding.  He was in his brace.  The only concern with him postoperatively is  that he left the ward without doctor's orders to go smoke a cigarette.  The  understanding was that he would quit before this operation.   A long talk was had with the patient about the dangers of smoking  postoperatively.  He claimed that he is going to do his best to not do that.  We will simply have to see.   At the time of discharge vital signs are stable.  His incision looked clear.  His  motor sensory examination of the lower extremities was normal.  He was  discharged home.  He was given Lorcet 10 on a p.r.n. basis for pain.  He was  given Flexeril 10 mg p.o. three times daily for a two-week time.  He will be  seen in my office in 10 days' time.  His condition at discharge was markedly  improved.   DISCHARGE DIAGNOSES:  Same.                                               Ricky D. Gasper Sells, M.D.    RDH/MEDQ  D:  10/09/2002  T:  10/10/2002  Job:  [id number]

## 2011-02-04 LAB — BLOOD GAS, ARTERIAL
Acid-base deficit: 3.8 — ABNORMAL HIGH
Bicarbonate: 20.7
TCO2: 18.3
pCO2 arterial: 37.9
pO2, Arterial: 93.4

## 2012-04-14 ENCOUNTER — Other Ambulatory Visit (HOSPITAL_COMMUNITY): Payer: Self-pay | Admitting: Internal Medicine

## 2012-04-14 DIAGNOSIS — R7401 Elevation of levels of liver transaminase levels: Secondary | ICD-10-CM

## 2012-04-16 ENCOUNTER — Ambulatory Visit (HOSPITAL_COMMUNITY)
Admission: RE | Admit: 2012-04-16 | Discharge: 2012-04-16 | Disposition: A | Discharge status: Home / Self Care | Payer: Medicare Other | Source: Ambulatory Visit | Attending: Internal Medicine | Admitting: Internal Medicine

## 2012-04-16 DIAGNOSIS — N200 Calculus of kidney: Secondary | ICD-10-CM | POA: Insufficient documentation

## 2012-04-16 DIAGNOSIS — R7401 Elevation of levels of liver transaminase levels: Secondary | ICD-10-CM

## 2013-07-03 ENCOUNTER — Emergency Department (HOSPITAL_COMMUNITY)
Admission: EM | Admit: 2013-07-03 | Discharge: 2013-07-03 | Disposition: A | Discharge status: Home / Self Care | Payer: Medicare Other | Attending: Emergency Medicine | Admitting: Emergency Medicine

## 2013-07-03 ENCOUNTER — Emergency Department (HOSPITAL_COMMUNITY): Payer: Medicare Other

## 2013-07-03 ENCOUNTER — Encounter (HOSPITAL_COMMUNITY): Payer: Self-pay | Admitting: Emergency Medicine

## 2013-07-03 DIAGNOSIS — Y929 Unspecified place or not applicable: Secondary | ICD-10-CM | POA: Insufficient documentation

## 2013-07-03 DIAGNOSIS — Y9389 Activity, other specified: Secondary | ICD-10-CM | POA: Insufficient documentation

## 2013-07-03 DIAGNOSIS — S20219A Contusion of unspecified front wall of thorax, initial encounter: Secondary | ICD-10-CM

## 2013-07-03 DIAGNOSIS — J4489 Other specified chronic obstructive pulmonary disease: Secondary | ICD-10-CM | POA: Insufficient documentation

## 2013-07-03 DIAGNOSIS — Z79899 Other long term (current) drug therapy: Secondary | ICD-10-CM | POA: Insufficient documentation

## 2013-07-03 DIAGNOSIS — J449 Chronic obstructive pulmonary disease, unspecified: Secondary | ICD-10-CM | POA: Insufficient documentation

## 2013-07-03 DIAGNOSIS — F17200 Nicotine dependence, unspecified, uncomplicated: Secondary | ICD-10-CM | POA: Insufficient documentation

## 2013-07-03 DIAGNOSIS — W1809XA Striking against other object with subsequent fall, initial encounter: Secondary | ICD-10-CM | POA: Insufficient documentation

## 2013-07-03 HISTORY — DX: Unspecified asthma, uncomplicated: J45.909

## 2013-07-03 HISTORY — DX: Chronic obstructive pulmonary disease, unspecified: J44.9

## 2013-07-03 MED ORDER — IBUPROFEN 800 MG PO TABS: 800.0000 mg | ORAL_TABLET | Freq: Three times a day (TID) | ORAL | Status: DC

## 2013-07-03 NOTE — ED Provider Notes (Signed)
Medical screening examination/treatment/procedure(s) were performed by non-physician practitioner and as supervising physician I was immediately available for consultation/collaboration.   EKG Interpretation None        Hayzlee Mcsorley L Rebacca Votaw, MD 07/03/13 2330 

## 2013-07-03 NOTE — ED Provider Notes (Signed)
CSN: [id number]     Arrival date & time 07/03/13  2016 History   First MD Initiated Contact with Patient 07/03/13 2048     Chief Complaint  Patient presents with  . Fall  . rib pain      (Consider location/radiation/quality/duration/timing/severity/associated sxs/prior Treatment) Patient is a 58 y.o. male presenting with fall. The history is provided by the patient. No language interpreter was used.  Fall This is a new problem. The current episode started yesterday. Associated symptoms include chest pain. Pertinent negatives include no abdominal pain, coughing, fever, headaches, neck pain or vomiting. Associated symptoms comments: He was standing on an unstable object cleaning the windshield of his car yesterday when he fell, hitting his left chest on the hood of the car. He complains of pain in the left lateral chest since that time. No SOB, cough, fever. No N, V..    Past Medical History  Diagnosis Date  . Asthma   . COPD (chronic obstructive pulmonary disease)    Past Surgical History  Procedure Laterality Date  . Back surgery     History reviewed. No pertinent family history. History  Substance Use Topics  . Smoking status: Smoker, Current Status Unknown -- 1.50 packs/day    Types: Cigarettes  . Smokeless tobacco: Not on file  . Alcohol Use: Yes     Comment: ocassional beer    Review of Systems  Constitutional: Negative for fever.  Respiratory: Negative for cough and shortness of breath.   Cardiovascular: Positive for chest pain.  Gastrointestinal: Negative for vomiting and abdominal pain.  Musculoskeletal: Negative for back pain and neck pain.  Neurological: Negative for headaches.      Allergies  Abilify and Azithromycin  Home Medications   Current Outpatient Rx  Name  Route  Sig  Dispense  Refill  . albuterol (PROVENTIL) 2 MG tablet   Oral   Take 2 mg by mouth 4 (four) times daily.         Marland Kitchen. gabapentin (NEURONTIN) 800 MG tablet   Oral   Take 1  tablet by mouth 4 (four) times daily.         Marland Kitchen. HYDROcodone-acetaminophen (NORCO) 10-325 MG per tablet   Oral   Take 1 tablet by mouth every 6 (six) hours as needed. pain         . morphine (AVINZA) 60 MG 24 hr capsule   Oral   Take 60 mg by mouth daily as needed. pain         . omeprazole (PRILOSEC) 40 MG capsule   Oral   Take 40 mg by mouth daily.         . traMADol (ULTRAM) 50 MG tablet   Oral   Take 100 mg by mouth every 6 (six) hours as needed. pain          BP 131/86  Pulse 92  Temp(Src) 98.2 F (36.8 C) (Oral)  Resp 16  Ht 5\' 8"  (1.727 m)  Wt 135 lb (61.236 kg)  BMI 20.53 kg/m2  SpO2 94% Physical Exam  Constitutional: He is oriented to person, place, and time. He appears well-developed and well-nourished. No distress.  HENT:  Head: Atraumatic.  Neck: Normal range of motion.  Pulmonary/Chest: Effort normal and breath sounds normal. He exhibits tenderness.  Left lateral chest wall tenderness without bruising or swelling.   Abdominal: Soft. There is no tenderness.  Musculoskeletal: Normal range of motion.  Neurological: He is alert and oriented to person, place, and time.  Skin: Skin is warm and dry.  Psychiatric: He has a normal mood and affect.    ED Course  Procedures (including critical care time) Labs Review Labs Reviewed - No data to display Imaging Review Dg Chest 2 View  07/03/2013   CLINICAL DATA:  Left anterior chest pain.  Fall.  EXAM: CHEST  2 VIEW  COMPARISON:  01/28/2008  FINDINGS: Cardiac silhouette is normal in size. Normal mediastinal and hilar contours. The lungs are clear. No pleural effusion or pneumothorax.  The bony thorax is intact.  IMPRESSION: No active cardiopulmonary disease.   Electronically Signed   By: Amie Portland M.D.   On: 07/03/2013 21:06  Dg Chest 2 View  07/03/2013   CLINICAL DATA:  Left anterior chest pain.  Fall.  EXAM: CHEST  2 VIEW  COMPARISON:  01/28/2008  FINDINGS: Cardiac silhouette is normal in size. Normal  mediastinal and hilar contours. The lungs are clear. No pleural effusion or pneumothorax.  The bony thorax is intact.  IMPRESSION: No active cardiopulmonary disease.   Electronically Signed   By: Amie Portland M.D.   On: 07/03/2013 21:06     EKG Interpretation None      MDM   Final diagnoses:  None    1. Chest wall contusion  VSS. Negative X-ray for pulmonary finding, normal bony thorax suggesting chest wall bruising without fracture.     Arnoldo Hooker, PA-C 07/03/13 2130

## 2013-07-03 NOTE — Discharge Instructions (Signed)
Chest Contusion A chest contusion is a deep bruise on your chest area. Contusions are the result of an injury that caused bleeding under the skin. A chest contusion may involve bruising of the skin, muscles, or ribs. The contusion may turn blue, purple, or yellow. Minor injuries will give you a painless contusion, but more severe contusions may stay painful and swollen for a few weeks. CAUSES  A contusion is usually caused by a blow, trauma, or direct force to an area of the body. SYMPTOMS   Swelling and redness of the injured area.  Discoloration of the injured area.  Tenderness and soreness of the injured area.  Pain. DIAGNOSIS  The diagnosis can be made by taking a history and performing a physical exam. An X-ray, CT scan, or MRI may be needed to determine if there were any associated injuries, such as broken bones (fractures) or internal injuries. TREATMENT  Often, the best treatment for a chest contusion is resting, icing, and applying cold compresses to the injured area. Deep breathing exercises may be recommended to reduce the risk of pneumonia. Over-the-counter medicines may also be recommended for pain control. HOME CARE INSTRUCTIONS   Put ice on the injured area.  Put ice in a plastic bag.  Place a towel between your skin and the bag.  Leave the ice on for 15-20 minutes, 03-04 times a day.  Only take over-the-counter or prescription medicines as directed by your caregiver. Your caregiver may recommend avoiding anti-inflammatory medicines (aspirin, ibuprofen, and naproxen) for 48 hours because these medicines may increase bruising.  Rest the injured area.  Perform deep-breathing exercises as directed by your caregiver.  Stop smoking if you smoke.  Do not lift objects over 5 pounds (2.3 kg) for 3 days or longer if recommended by your caregiver. SEEK IMMEDIATE MEDICAL CARE IF:   You have increased bruising or swelling.  You have pain that is getting worse.  You have  difficulty breathing.  You have dizziness, weakness, or fainting.  You have blood in your urine or stool.  You cough up or vomit blood.  Your swelling or pain is not relieved with medicines. MAKE SURE YOU:   Understand these instructions.  Will watch your condition.  Will get help right away if you are not doing well or get worse. Document Released: 01/15/2001 Document Revised: 01/15/2012 Document Reviewed: 10/14/2011 [facility] Patient Information 2014 [place], [place]. Cryotherapy Cryotherapy means treatment with cold. Ice or gel packs can be used to reduce both pain and swelling. Ice is the most helpful within the first 24 to 48 hours after an injury or flareup from overusing a muscle or joint. Sprains, strains, spasms, burning pain, shooting pain, and aches can all be eased with ice. Ice can also be used when recovering from surgery. Ice is effective, has very few side effects, and is safe for most people to use. PRECAUTIONS  Ice is not a safe treatment option for people with:  Raynaud's phenomenon. This is a condition affecting small blood vessels in the extremities. Exposure to cold may cause your problems to return.  Cold hypersensitivity. There are many forms of cold hypersensitivity, including:  Cold urticaria. Red, itchy hives appear on the skin when the tissues begin to warm after being iced.  Cold erythema. This is a red, itchy rash caused by exposure to cold.  Cold hemoglobinuria. Red blood cells break down when the tissues begin to warm after being iced. The hemoglobin that carry oxygen are passed into the urine because  they cannot combine with blood proteins fast enough.  Numbness or altered sensitivity in the area being iced. If you have any of the following conditions, do not use ice until you have discussed cryotherapy with your caregiver:  Heart conditions, such as arrhythmia, angina, or chronic heart disease.  High blood pressure.  Healing wounds or open skin  in the area being iced.  Current infections.  Rheumatoid arthritis.  Poor circulation.  Diabetes. Ice slows the blood flow in the region it is applied. This is beneficial when trying to stop inflamed tissues from spreading irritating chemicals to surrounding tissues. However, if you expose your skin to cold temperatures for too long or without the proper protection, you can damage your skin or nerves. Watch for signs of skin damage due to cold. HOME CARE INSTRUCTIONS Follow these tips to use ice and cold packs safely.  Place a dry or damp towel between the ice and skin. A damp towel will cool the skin more quickly, so you may need to shorten the time that the ice is used.  For a more rapid response, add gentle compression to the ice.  Ice for no more than 10 to 20 minutes at a time. The bonier the area you are icing, the less time it will take to get the benefits of ice.  Check your skin after 5 minutes to make sure there are no signs of a poor response to cold or skin damage.  Rest 20 minutes or more in between uses.  Once your skin is numb, you can end your treatment. You can test numbness by very lightly touching your skin. The touch should be so light that you do not see the skin dimple from the pressure of your fingertip. When using ice, most people will feel these normal sensations in this order: cold, burning, aching, and numbness.  Do not use ice on someone who cannot communicate their responses to pain, such as small children or people with dementia. HOW TO MAKE AN ICE PACK Ice packs are the most common way to use ice therapy. Other methods include ice massage, ice baths, and cryo-sprays. Muscle creams that cause a cold, tingly feeling do not offer the same benefits that ice offers and should not be used as a substitute unless recommended by your caregiver. To make an ice pack, do one of the following:  Place crushed ice or a bag of frozen vegetables in a sealable plastic bag.  Squeeze out the excess air. Place this bag inside another plastic bag. Slide the bag into a pillowcase or place a damp towel between your skin and the bag.  Mix 3 parts water with 1 part rubbing alcohol. Freeze the mixture in a sealable plastic bag. When you remove the mixture from the freezer, it will be slushy. Squeeze out the excess air. Place this bag inside another plastic bag. Slide the bag into a pillowcase or place a damp towel between your skin and the bag. SEEK MEDICAL CARE IF:  You develop white spots on your skin. This may give the skin a blotchy (mottled) appearance.  Your skin turns blue or pale.  Your skin becomes waxy or hard.  Your swelling gets worse. MAKE SURE YOU:   Understand these instructions.  Will watch your condition.  Will get help right away if you are not doing well or get worse. Document Released: 12/17/2010 Document Revised: 07/15/2011 Document Reviewed: 12/17/2010 [facility] Patient Information 2014 [place]

## 2013-07-03 NOTE — ED Notes (Signed)
Fell off 5 gallon bucket, hitting car fender w/L chest then onto ground.

## 2014-05-12 DIAGNOSIS — G894 Chronic pain syndrome: Secondary | ICD-10-CM | POA: Diagnosis not present

## 2014-05-12 DIAGNOSIS — Z6820 Body mass index (BMI) 20.0-20.9, adult: Secondary | ICD-10-CM | POA: Diagnosis not present

## 2014-06-08 DIAGNOSIS — J180 Bronchopneumonia, unspecified organism: Secondary | ICD-10-CM | POA: Diagnosis not present

## 2014-06-08 DIAGNOSIS — Z681 Body mass index (BMI) 19 or less, adult: Secondary | ICD-10-CM | POA: Diagnosis not present

## 2014-06-15 DIAGNOSIS — Z681 Body mass index (BMI) 19 or less, adult: Secondary | ICD-10-CM | POA: Diagnosis not present

## 2014-06-15 DIAGNOSIS — J069 Acute upper respiratory infection, unspecified: Secondary | ICD-10-CM | POA: Diagnosis not present

## 2014-06-15 DIAGNOSIS — J449 Chronic obstructive pulmonary disease, unspecified: Secondary | ICD-10-CM | POA: Diagnosis not present

## 2014-07-27 DIAGNOSIS — G894 Chronic pain syndrome: Secondary | ICD-10-CM | POA: Diagnosis not present

## 2014-07-27 DIAGNOSIS — Z6820 Body mass index (BMI) 20.0-20.9, adult: Secondary | ICD-10-CM | POA: Diagnosis not present

## 2014-07-27 DIAGNOSIS — G4709 Other insomnia: Secondary | ICD-10-CM | POA: Diagnosis not present

## 2014-08-29 DIAGNOSIS — Z719 Counseling, unspecified: Secondary | ICD-10-CM | POA: Diagnosis not present

## 2014-08-29 DIAGNOSIS — Z6820 Body mass index (BMI) 20.0-20.9, adult: Secondary | ICD-10-CM | POA: Diagnosis not present

## 2014-08-29 DIAGNOSIS — J449 Chronic obstructive pulmonary disease, unspecified: Secondary | ICD-10-CM | POA: Diagnosis not present

## 2014-08-29 DIAGNOSIS — G894 Chronic pain syndrome: Secondary | ICD-10-CM | POA: Diagnosis not present

## 2014-09-01 DIAGNOSIS — M961 Postlaminectomy syndrome, not elsewhere classified: Secondary | ICD-10-CM | POA: Diagnosis not present

## 2014-09-14 DIAGNOSIS — M961 Postlaminectomy syndrome, not elsewhere classified: Secondary | ICD-10-CM | POA: Diagnosis not present

## 2014-09-19 DIAGNOSIS — M542 Cervicalgia: Secondary | ICD-10-CM | POA: Diagnosis not present

## 2014-09-19 DIAGNOSIS — M961 Postlaminectomy syndrome, not elsewhere classified: Secondary | ICD-10-CM | POA: Diagnosis not present

## 2014-09-28 DIAGNOSIS — K219 Gastro-esophageal reflux disease without esophagitis: Secondary | ICD-10-CM | POA: Diagnosis not present

## 2014-09-28 DIAGNOSIS — G894 Chronic pain syndrome: Secondary | ICD-10-CM | POA: Diagnosis not present

## 2014-09-28 DIAGNOSIS — Z681 Body mass index (BMI) 19 or less, adult: Secondary | ICD-10-CM | POA: Diagnosis not present

## 2014-10-07 DIAGNOSIS — M542 Cervicalgia: Secondary | ICD-10-CM | POA: Diagnosis not present

## 2014-10-07 DIAGNOSIS — M961 Postlaminectomy syndrome, not elsewhere classified: Secondary | ICD-10-CM | POA: Diagnosis not present

## 2014-10-28 DIAGNOSIS — Z681 Body mass index (BMI) 19 or less, adult: Secondary | ICD-10-CM | POA: Diagnosis not present

## 2014-10-28 DIAGNOSIS — G894 Chronic pain syndrome: Secondary | ICD-10-CM | POA: Diagnosis not present

## 2014-10-28 DIAGNOSIS — Z1389 Encounter for screening for other disorder: Secondary | ICD-10-CM | POA: Diagnosis not present

## 2014-10-28 DIAGNOSIS — J449 Chronic obstructive pulmonary disease, unspecified: Secondary | ICD-10-CM | POA: Diagnosis not present

## 2015-01-06 DIAGNOSIS — Z1389 Encounter for screening for other disorder: Secondary | ICD-10-CM | POA: Diagnosis not present

## 2015-01-06 DIAGNOSIS — Z6820 Body mass index (BMI) 20.0-20.9, adult: Secondary | ICD-10-CM | POA: Diagnosis not present

## 2015-01-06 DIAGNOSIS — G894 Chronic pain syndrome: Secondary | ICD-10-CM | POA: Diagnosis not present

## 2015-02-20 DIAGNOSIS — G894 Chronic pain syndrome: Secondary | ICD-10-CM | POA: Diagnosis not present

## 2015-02-20 DIAGNOSIS — Z1389 Encounter for screening for other disorder: Secondary | ICD-10-CM | POA: Diagnosis not present

## 2015-02-20 DIAGNOSIS — Z6820 Body mass index (BMI) 20.0-20.9, adult: Secondary | ICD-10-CM | POA: Diagnosis not present

## 2015-04-25 DIAGNOSIS — Z1389 Encounter for screening for other disorder: Secondary | ICD-10-CM | POA: Diagnosis not present

## 2015-04-25 DIAGNOSIS — G4701 Insomnia due to medical condition: Secondary | ICD-10-CM | POA: Diagnosis not present

## 2015-04-25 DIAGNOSIS — G894 Chronic pain syndrome: Secondary | ICD-10-CM | POA: Diagnosis not present

## 2015-04-25 DIAGNOSIS — Z6821 Body mass index (BMI) 21.0-21.9, adult: Secondary | ICD-10-CM | POA: Diagnosis not present

## 2015-06-30 DIAGNOSIS — Z6820 Body mass index (BMI) 20.0-20.9, adult: Secondary | ICD-10-CM | POA: Diagnosis not present

## 2015-06-30 DIAGNOSIS — Z1389 Encounter for screening for other disorder: Secondary | ICD-10-CM | POA: Diagnosis not present

## 2015-06-30 DIAGNOSIS — G894 Chronic pain syndrome: Secondary | ICD-10-CM | POA: Diagnosis not present

## 2015-06-30 DIAGNOSIS — J449 Chronic obstructive pulmonary disease, unspecified: Secondary | ICD-10-CM | POA: Diagnosis not present

## 2015-08-21 ENCOUNTER — Encounter (HOSPITAL_COMMUNITY): Payer: Self-pay | Admitting: Emergency Medicine

## 2015-08-21 ENCOUNTER — Emergency Department (HOSPITAL_COMMUNITY)
Admission: EM | Admit: 2015-08-21 | Discharge: 2015-08-21 | Disposition: A | Discharge status: Home / Self Care | Payer: Commercial Managed Care - HMO | Attending: Emergency Medicine | Admitting: Emergency Medicine

## 2015-08-21 ENCOUNTER — Emergency Department (HOSPITAL_COMMUNITY): Payer: Commercial Managed Care - HMO

## 2015-08-21 DIAGNOSIS — F17210 Nicotine dependence, cigarettes, uncomplicated: Secondary | ICD-10-CM | POA: Insufficient documentation

## 2015-08-21 DIAGNOSIS — M5431 Sciatica, right side: Secondary | ICD-10-CM

## 2015-08-21 DIAGNOSIS — R55 Syncope and collapse: Secondary | ICD-10-CM | POA: Diagnosis not present

## 2015-08-21 DIAGNOSIS — J45909 Unspecified asthma, uncomplicated: Secondary | ICD-10-CM | POA: Insufficient documentation

## 2015-08-21 DIAGNOSIS — J449 Chronic obstructive pulmonary disease, unspecified: Secondary | ICD-10-CM | POA: Insufficient documentation

## 2015-08-21 DIAGNOSIS — Z79899 Other long term (current) drug therapy: Secondary | ICD-10-CM | POA: Insufficient documentation

## 2015-08-21 DIAGNOSIS — M25551 Pain in right hip: Secondary | ICD-10-CM | POA: Diagnosis not present

## 2015-08-21 MED ORDER — PREDNISONE 10 MG PO TABS: ORAL_TABLET | ORAL | Status: DC

## 2015-08-21 MED ORDER — CYCLOBENZAPRINE HCL 10 MG PO TABS: 10.0000 mg | ORAL_TABLET | Freq: Three times a day (TID) | ORAL | Status: DC | PRN

## 2015-08-21 NOTE — ED Notes (Signed)
Pt reports getting up on Sat and feeling a pop in his R hip. Pt c/o increasing pain and decreased mobility since.

## 2015-08-21 NOTE — Discharge Instructions (Signed)
°  Sciatica °Sciatica is pain, weakness, numbness, or tingling along your sciatic nerve. The nerve starts in the lower back and runs down the back of each leg. Nerve damage or certain conditions pinch or put pressure on the sciatic nerve. This causes the pain, weakness, and other discomforts of sciatica. °HOME CARE  °· Only take medicine as told by your doctor. °· Apply ice to the affected area for 20 minutes. Do this 3-4 times a day for the first 48-72 hours. Then try heat in the same way. °· Exercise, stretch, or do your usual activities if these do not make your pain worse. °· Go to physical therapy as told by your doctor. °· Keep all doctor visits as told. °· Do not wear high heels or shoes that are not supportive. °· Get a firm mattress if your mattress is too soft to lessen pain and discomfort. °GET HELP RIGHT AWAY IF:  °· You cannot control when you poop (bowel movement) or pee (urinate). °· You have more weakness in your lower back, lower belly (pelvis), butt (buttocks), or legs. °· You have redness or puffiness (swelling) of your back. °· You have a burning feeling when you pee. °· You have pain that gets worse when you lie down. °· You have pain that wakes you from your sleep. °· Your pain is worse than past pain. °· Your pain lasts longer than 4 weeks. °· You are suddenly losing weight without reason. °MAKE SURE YOU:  °· Understand these instructions. °· Will watch this condition. °· Will get help right away if you are not doing well or get worse. °  °This information is not intended to replace advice given to you by your health care provider. Make sure you discuss any questions you have with your health care provider. °  °Document Released: 01/30/2008 Document Revised: 01/11/2015 Document Reviewed: 09/01/2011 °Elsevier Interactive Patient Education ©2016 [employer] ° ° °

## 2015-08-21 NOTE — ED Provider Notes (Signed)
CSN: [id number]     Arrival date & time 08/21/15  1253 History  By signing my name below, I, [name], attest that this documentation has been prepared under the direction and in the presence of [name], PA-C Electronically Signed: [name], ED Scribe. 08/21/2015. 2:47 PM.   Chief Complaint  Patient presents with  . Hip Pain      Patient is a 60 y.o. male presenting with hip pain. The history is provided by the patient. No language interpreter was used.  Hip Pain The current episode started 2 days ago. The problem has not changed since onset.Pertinent negatives include no abdominal pain and no shortness of breath. The symptoms are aggravated by walking. Nothing relieves the symptoms. He has tried nothing for the symptoms. The treatment provided no relief.    Jamie Lam is a 60 y.o. male who presents to the Emergency Department complaining of right hip pain onset 2 days ago. Pt is unsure of injury or trauma. Pt states that it felt as if his right hip popped out of place and the pain radiates to his right foot. Pt reports that his right hip pain worsens with ambulation and bearing weight. Pt denies any alleviating factors. Pt is having associated symptoms of right lower leg with tingling/cramping. He notes that he has tried hydrocodone for the relief of his symptoms. He denies fever, bowel/bladder incontinence, dysuria, numbness, abdominal pain, color change, wound, rash, and any other symptoms. Pt had back surgery in 2004.  Past Medical History  Diagnosis Date  . Asthma   . COPD (chronic obstructive pulmonary disease) [facility])    Past Surgical History  Procedure Laterality Date  . Back surgery     Family History  Problem Relation Age of Onset  . Heart failure Mother   . Cancer Father    Social History  Substance Use Topics  . Smoking status: Current Every Day Smoker -- 1.50 packs/day    Types: Cigarettes  . Smokeless tobacco: Never Used  . Alcohol Use: No      Comment: ocassional beer    Review of Systems  Constitutional: Negative for fever.  Respiratory: Negative for shortness of breath.   Gastrointestinal: Negative for vomiting, abdominal pain and constipation.  Genitourinary: Negative for dysuria, hematuria, flank pain, decreased urine volume and difficulty urinating.  Musculoskeletal: Positive for back pain. Negative for joint swelling.  Skin: Negative for rash.  Neurological: Positive for syncope. Negative for weakness and numbness.  All other systems reviewed and are negative.     Allergies  Abilify and Azithromycin  Home Medications   Prior to Admission medications   Medication Sig Start Date End Date Taking? Authorizing Provider  albuterol (PROVENTIL) 2 MG tablet Take 2 mg by mouth 4 (four) times daily. 06/14/13  Yes Historical Provider, MD  gabapentin (NEURONTIN) 800 MG tablet Take 1 tablet by mouth 4 (four) times daily. 06/20/13  Yes Historical Provider, MD  HYDROcodone-acetaminophen (NORCO) 10-325 MG per tablet Take 1 tablet by mouth every 6 (six) hours as needed. pain 06/26/13  Yes Historical Provider, MD  omeprazole (PRILOSEC) 40 MG capsule Take 40 mg by mouth daily. 05/29/13  Yes Historical Provider, MD  traMADol (ULTRAM) 50 MG tablet Take 100 mg by mouth every 6 (six) hours as needed. pain 04/27/13  Yes Historical Provider, MD  zolpidem (AMBIEN) 10 MG tablet Take 1 tablet by mouth at bedtime as needed for sleep.  07/13/15  Yes Historical Provider, MD   BP 106/73 mmHg  Pulse 94  Temp(Src) 98.1 F (36.7 C) (Oral)  Resp 18  Ht 5\' 8"  (1.727 m)  Wt 135 lb (61.236 kg)  BMI 20.53 kg/m2  SpO2 94% Physical Exam  Constitutional: He is oriented to person, place, and time. He appears well-developed and well-nourished. No distress.  HENT:  Head: Normocephalic and atraumatic.  Eyes: EOM are normal.  Neck: Neck supple.  Cardiovascular: Normal rate, regular rhythm and normal heart sounds.  Exam reveals no gallop and no friction rub.    No murmur heard. Pulmonary/Chest: Effort normal and breath sounds normal. No respiratory distress. He has no wheezes. He has no rales.  Abdominal: He exhibits no distension.  Musculoskeletal: Normal range of motion.  Focal tenderness right SI joint space. No spinal tenderness. Negative SLR bilaterally.   Neurological: He is alert and oriented to person, place, and time.  Skin: Skin is warm and dry.  Psychiatric: He has a normal mood and affect. His behavior is normal.  Nursing note and vitals reviewed.   ED Course  Procedures (including critical care time) DIAGNOSTIC STUDIES: Oxygen Saturation is 94% on RA, adequate by my interpretation.    COORDINATION OF CARE: 2:47 PM Discussed treatment plan with pt at bedside which includes hip unilateral with pelvis xray, pain medication Rx and pt agreed to plan.  3:01 PM- Per [name] database, it was found that the pt was Rx #240 tablets of tramadol on 08/09/2015 and has a Rx of hydrocodone pending to be refilled on 08/30/2015. Pt was informed and understood that no narcotics will be prescribed today.   Labs Review Labs Reviewed - No data to display  Imaging Review Dg Hip Unilat With Pelvis 2-3 Views Right  08/21/2015  CLINICAL DATA:  Acute onset right hip pain and limited range of motion morning of 08/19/2015. No known injury. Initial encounter. EXAM: DG HIP (WITH OR WITHOUT PELVIS) 2-3V RIGHT COMPARISON:  None. FINDINGS: There is no evidence of hip fracture or dislocation. There is no evidence of arthropathy or other focal bone abnormality. IMPRESSION: Negative exam. Electronically Signed   By: Drusilla Kannerhomas  Dalessio M.D.   On: 08/21/2015 13:55   I have personally reviewed and evaluated these images as part of my medical decision-making.   EKG Interpretation None      MDM   Final diagnoses:  Sciatica of right side    Pt is ambulatory with steady gait.  No focal neuro deficits, no concerning sx for emergent neurological process.    I personally  performed the services described in this documentation, which was scribed in my presence. The recorded information has been reviewed and is accurate.    Pauline Ausammy [name], PA-C 08/23/15 2135  Eber HongBrian Miller, MD 08/24/15 0001

## 2015-09-08 DIAGNOSIS — Z1389 Encounter for screening for other disorder: Secondary | ICD-10-CM | POA: Diagnosis not present

## 2015-09-08 DIAGNOSIS — Z681 Body mass index (BMI) 19 or less, adult: Secondary | ICD-10-CM | POA: Diagnosis not present

## 2015-09-08 DIAGNOSIS — M25551 Pain in right hip: Secondary | ICD-10-CM | POA: Diagnosis not present

## 2015-09-08 DIAGNOSIS — S7001XA Contusion of right hip, initial encounter: Secondary | ICD-10-CM | POA: Diagnosis not present

## 2015-09-13 DIAGNOSIS — M9901 Segmental and somatic dysfunction of cervical region: Secondary | ICD-10-CM | POA: Diagnosis not present

## 2015-09-13 DIAGNOSIS — M5136 Other intervertebral disc degeneration, lumbar region: Secondary | ICD-10-CM | POA: Diagnosis not present

## 2015-09-13 DIAGNOSIS — M9903 Segmental and somatic dysfunction of lumbar region: Secondary | ICD-10-CM | POA: Diagnosis not present

## 2015-09-13 DIAGNOSIS — M542 Cervicalgia: Secondary | ICD-10-CM | POA: Diagnosis not present

## 2015-09-13 DIAGNOSIS — M546 Pain in thoracic spine: Secondary | ICD-10-CM | POA: Diagnosis not present

## 2015-09-13 DIAGNOSIS — M5134 Other intervertebral disc degeneration, thoracic region: Secondary | ICD-10-CM | POA: Diagnosis not present

## 2015-09-13 DIAGNOSIS — M9902 Segmental and somatic dysfunction of thoracic region: Secondary | ICD-10-CM | POA: Diagnosis not present

## 2015-09-13 DIAGNOSIS — M545 Low back pain: Secondary | ICD-10-CM | POA: Diagnosis not present

## 2015-09-13 DIAGNOSIS — M5030 Other cervical disc degeneration, unspecified cervical region: Secondary | ICD-10-CM | POA: Diagnosis not present

## 2015-09-19 DIAGNOSIS — M546 Pain in thoracic spine: Secondary | ICD-10-CM | POA: Diagnosis not present

## 2015-09-19 DIAGNOSIS — M5030 Other cervical disc degeneration, unspecified cervical region: Secondary | ICD-10-CM | POA: Diagnosis not present

## 2015-09-19 DIAGNOSIS — M5134 Other intervertebral disc degeneration, thoracic region: Secondary | ICD-10-CM | POA: Diagnosis not present

## 2015-09-19 DIAGNOSIS — M9903 Segmental and somatic dysfunction of lumbar region: Secondary | ICD-10-CM | POA: Diagnosis not present

## 2015-09-19 DIAGNOSIS — M542 Cervicalgia: Secondary | ICD-10-CM | POA: Diagnosis not present

## 2015-09-19 DIAGNOSIS — M5136 Other intervertebral disc degeneration, lumbar region: Secondary | ICD-10-CM | POA: Diagnosis not present

## 2015-09-19 DIAGNOSIS — M9901 Segmental and somatic dysfunction of cervical region: Secondary | ICD-10-CM | POA: Diagnosis not present

## 2015-09-19 DIAGNOSIS — M9902 Segmental and somatic dysfunction of thoracic region: Secondary | ICD-10-CM | POA: Diagnosis not present

## 2015-09-19 DIAGNOSIS — M545 Low back pain: Secondary | ICD-10-CM | POA: Diagnosis not present

## 2015-09-20 DIAGNOSIS — M9903 Segmental and somatic dysfunction of lumbar region: Secondary | ICD-10-CM | POA: Diagnosis not present

## 2015-09-20 DIAGNOSIS — M542 Cervicalgia: Secondary | ICD-10-CM | POA: Diagnosis not present

## 2015-09-20 DIAGNOSIS — M9901 Segmental and somatic dysfunction of cervical region: Secondary | ICD-10-CM | POA: Diagnosis not present

## 2015-09-20 DIAGNOSIS — M5136 Other intervertebral disc degeneration, lumbar region: Secondary | ICD-10-CM | POA: Diagnosis not present

## 2015-09-20 DIAGNOSIS — M5134 Other intervertebral disc degeneration, thoracic region: Secondary | ICD-10-CM | POA: Diagnosis not present

## 2015-09-20 DIAGNOSIS — M5030 Other cervical disc degeneration, unspecified cervical region: Secondary | ICD-10-CM | POA: Diagnosis not present

## 2015-09-20 DIAGNOSIS — M546 Pain in thoracic spine: Secondary | ICD-10-CM | POA: Diagnosis not present

## 2015-09-20 DIAGNOSIS — M9902 Segmental and somatic dysfunction of thoracic region: Secondary | ICD-10-CM | POA: Diagnosis not present

## 2015-09-20 DIAGNOSIS — M545 Low back pain: Secondary | ICD-10-CM | POA: Diagnosis not present

## 2015-09-29 DIAGNOSIS — K219 Gastro-esophageal reflux disease without esophagitis: Secondary | ICD-10-CM | POA: Diagnosis not present

## 2015-09-29 DIAGNOSIS — Z681 Body mass index (BMI) 19 or less, adult: Secondary | ICD-10-CM | POA: Diagnosis not present

## 2015-09-29 DIAGNOSIS — Z1389 Encounter for screening for other disorder: Secondary | ICD-10-CM | POA: Diagnosis not present

## 2015-09-29 DIAGNOSIS — G894 Chronic pain syndrome: Secondary | ICD-10-CM | POA: Diagnosis not present

## 2015-09-29 DIAGNOSIS — M5136 Other intervertebral disc degeneration, lumbar region: Secondary | ICD-10-CM | POA: Diagnosis not present

## 2015-09-29 DIAGNOSIS — Z79899 Other long term (current) drug therapy: Secondary | ICD-10-CM | POA: Diagnosis not present

## 2015-10-13 DIAGNOSIS — R200 Anesthesia of skin: Secondary | ICD-10-CM | POA: Diagnosis not present

## 2015-10-13 DIAGNOSIS — M25551 Pain in right hip: Secondary | ICD-10-CM | POA: Diagnosis not present

## 2015-10-13 DIAGNOSIS — Z9181 History of falling: Secondary | ICD-10-CM | POA: Diagnosis not present

## 2015-10-17 DIAGNOSIS — Z681 Body mass index (BMI) 19 or less, adult: Secondary | ICD-10-CM | POA: Diagnosis not present

## 2015-10-17 DIAGNOSIS — M5116 Intervertebral disc disorders with radiculopathy, lumbar region: Secondary | ICD-10-CM | POA: Diagnosis not present

## 2015-10-17 DIAGNOSIS — M5136 Other intervertebral disc degeneration, lumbar region: Secondary | ICD-10-CM | POA: Diagnosis not present

## 2015-11-08 ENCOUNTER — Other Ambulatory Visit (HOSPITAL_COMMUNITY): Payer: Self-pay | Admitting: Physician Assistant

## 2015-11-08 DIAGNOSIS — G894 Chronic pain syndrome: Secondary | ICD-10-CM | POA: Diagnosis not present

## 2015-11-08 DIAGNOSIS — J449 Chronic obstructive pulmonary disease, unspecified: Secondary | ICD-10-CM | POA: Diagnosis not present

## 2015-11-08 DIAGNOSIS — L720 Epidermal cyst: Secondary | ICD-10-CM | POA: Diagnosis not present

## 2015-11-08 DIAGNOSIS — M5136 Other intervertebral disc degeneration, lumbar region: Secondary | ICD-10-CM

## 2015-11-08 DIAGNOSIS — Z6821 Body mass index (BMI) 21.0-21.9, adult: Secondary | ICD-10-CM | POA: Diagnosis not present

## 2015-12-28 DIAGNOSIS — M4726 Other spondylosis with radiculopathy, lumbar region: Secondary | ICD-10-CM | POA: Diagnosis not present

## 2015-12-28 DIAGNOSIS — M5117 Intervertebral disc disorders with radiculopathy, lumbosacral region: Secondary | ICD-10-CM | POA: Diagnosis not present

## 2015-12-28 DIAGNOSIS — M4727 Other spondylosis with radiculopathy, lumbosacral region: Secondary | ICD-10-CM | POA: Diagnosis not present

## 2015-12-28 DIAGNOSIS — M5116 Intervertebral disc disorders with radiculopathy, lumbar region: Secondary | ICD-10-CM | POA: Diagnosis not present

## 2016-02-19 DIAGNOSIS — Z1389 Encounter for screening for other disorder: Secondary | ICD-10-CM | POA: Diagnosis not present

## 2016-02-19 DIAGNOSIS — Z6820 Body mass index (BMI) 20.0-20.9, adult: Secondary | ICD-10-CM | POA: Diagnosis not present

## 2016-02-19 DIAGNOSIS — Z0000 Encounter for general adult medical examination without abnormal findings: Secondary | ICD-10-CM | POA: Diagnosis not present

## 2016-02-19 DIAGNOSIS — J449 Chronic obstructive pulmonary disease, unspecified: Secondary | ICD-10-CM | POA: Diagnosis not present

## 2016-02-19 DIAGNOSIS — G894 Chronic pain syndrome: Secondary | ICD-10-CM | POA: Diagnosis not present

## 2016-02-19 DIAGNOSIS — Z23 Encounter for immunization: Secondary | ICD-10-CM | POA: Diagnosis not present

## 2016-05-28 DIAGNOSIS — K219 Gastro-esophageal reflux disease without esophagitis: Secondary | ICD-10-CM | POA: Diagnosis not present

## 2016-05-28 DIAGNOSIS — J449 Chronic obstructive pulmonary disease, unspecified: Secondary | ICD-10-CM | POA: Diagnosis not present

## 2016-05-28 DIAGNOSIS — Z1389 Encounter for screening for other disorder: Secondary | ICD-10-CM | POA: Diagnosis not present

## 2016-05-28 DIAGNOSIS — Z681 Body mass index (BMI) 19 or less, adult: Secondary | ICD-10-CM | POA: Diagnosis not present

## 2016-05-28 DIAGNOSIS — R5383 Other fatigue: Secondary | ICD-10-CM | POA: Diagnosis not present

## 2016-09-10 DIAGNOSIS — Z1389 Encounter for screening for other disorder: Secondary | ICD-10-CM | POA: Diagnosis not present

## 2016-09-10 DIAGNOSIS — Z6820 Body mass index (BMI) 20.0-20.9, adult: Secondary | ICD-10-CM | POA: Diagnosis not present

## 2016-09-10 DIAGNOSIS — M2550 Pain in unspecified joint: Secondary | ICD-10-CM | POA: Diagnosis not present

## 2016-09-10 DIAGNOSIS — K219 Gastro-esophageal reflux disease without esophagitis: Secondary | ICD-10-CM | POA: Diagnosis not present

## 2016-09-10 DIAGNOSIS — G894 Chronic pain syndrome: Secondary | ICD-10-CM | POA: Diagnosis not present

## 2016-09-10 DIAGNOSIS — R69 Illness, unspecified: Secondary | ICD-10-CM | POA: Diagnosis not present

## 2016-09-10 DIAGNOSIS — M1991 Primary osteoarthritis, unspecified site: Secondary | ICD-10-CM | POA: Diagnosis not present

## 2016-11-05 DIAGNOSIS — M545 Low back pain: Secondary | ICD-10-CM | POA: Diagnosis not present

## 2016-11-05 DIAGNOSIS — M5126 Other intervertebral disc displacement, lumbar region: Secondary | ICD-10-CM | POA: Diagnosis not present

## 2016-11-12 DIAGNOSIS — M19012 Primary osteoarthritis, left shoulder: Secondary | ICD-10-CM | POA: Diagnosis not present

## 2016-12-09 DIAGNOSIS — M1991 Primary osteoarthritis, unspecified site: Secondary | ICD-10-CM | POA: Diagnosis not present

## 2016-12-09 DIAGNOSIS — Z1389 Encounter for screening for other disorder: Secondary | ICD-10-CM | POA: Diagnosis not present

## 2016-12-09 DIAGNOSIS — M2550 Pain in unspecified joint: Secondary | ICD-10-CM | POA: Diagnosis not present

## 2016-12-09 DIAGNOSIS — Z6820 Body mass index (BMI) 20.0-20.9, adult: Secondary | ICD-10-CM | POA: Diagnosis not present

## 2016-12-09 DIAGNOSIS — G894 Chronic pain syndrome: Secondary | ICD-10-CM | POA: Diagnosis not present

## 2017-03-04 DIAGNOSIS — G894 Chronic pain syndrome: Secondary | ICD-10-CM | POA: Diagnosis not present

## 2017-03-04 DIAGNOSIS — Z6820 Body mass index (BMI) 20.0-20.9, adult: Secondary | ICD-10-CM | POA: Diagnosis not present

## 2017-03-04 DIAGNOSIS — Z1389 Encounter for screening for other disorder: Secondary | ICD-10-CM | POA: Diagnosis not present

## 2017-03-04 DIAGNOSIS — Z79899 Other long term (current) drug therapy: Secondary | ICD-10-CM | POA: Diagnosis not present

## 2017-03-04 DIAGNOSIS — Z23 Encounter for immunization: Secondary | ICD-10-CM | POA: Diagnosis not present

## 2017-04-08 DIAGNOSIS — Z125 Encounter for screening for malignant neoplasm of prostate: Secondary | ICD-10-CM | POA: Diagnosis not present

## 2017-05-13 DIAGNOSIS — Z0000 Encounter for general adult medical examination without abnormal findings: Secondary | ICD-10-CM | POA: Diagnosis not present

## 2017-05-13 DIAGNOSIS — Z1389 Encounter for screening for other disorder: Secondary | ICD-10-CM | POA: Diagnosis not present

## 2017-05-13 DIAGNOSIS — Z6820 Body mass index (BMI) 20.0-20.9, adult: Secondary | ICD-10-CM | POA: Diagnosis not present

## 2017-05-13 DIAGNOSIS — Z0001 Encounter for general adult medical examination with abnormal findings: Secondary | ICD-10-CM | POA: Diagnosis not present

## 2017-05-13 DIAGNOSIS — E782 Mixed hyperlipidemia: Secondary | ICD-10-CM | POA: Diagnosis not present

## 2017-05-30 DIAGNOSIS — J309 Allergic rhinitis, unspecified: Secondary | ICD-10-CM | POA: Diagnosis not present

## 2017-05-30 DIAGNOSIS — M1991 Primary osteoarthritis, unspecified site: Secondary | ICD-10-CM | POA: Diagnosis not present

## 2017-05-30 DIAGNOSIS — K219 Gastro-esophageal reflux disease without esophagitis: Secondary | ICD-10-CM | POA: Diagnosis not present

## 2017-05-30 DIAGNOSIS — G894 Chronic pain syndrome: Secondary | ICD-10-CM | POA: Diagnosis not present

## 2017-05-30 DIAGNOSIS — Z1389 Encounter for screening for other disorder: Secondary | ICD-10-CM | POA: Diagnosis not present

## 2017-05-30 DIAGNOSIS — Z6820 Body mass index (BMI) 20.0-20.9, adult: Secondary | ICD-10-CM | POA: Diagnosis not present

## 2017-10-09 DIAGNOSIS — G609 Hereditary and idiopathic neuropathy, unspecified: Secondary | ICD-10-CM | POA: Diagnosis not present

## 2017-10-09 DIAGNOSIS — G8929 Other chronic pain: Secondary | ICD-10-CM | POA: Diagnosis not present

## 2017-10-09 DIAGNOSIS — Z6820 Body mass index (BMI) 20.0-20.9, adult: Secondary | ICD-10-CM | POA: Diagnosis not present

## 2017-10-09 DIAGNOSIS — F1120 Opioid dependence, uncomplicated: Secondary | ICD-10-CM | POA: Diagnosis not present

## 2017-10-09 DIAGNOSIS — Z1389 Encounter for screening for other disorder: Secondary | ICD-10-CM | POA: Diagnosis not present

## 2017-11-04 DIAGNOSIS — Z681 Body mass index (BMI) 19 or less, adult: Secondary | ICD-10-CM | POA: Diagnosis not present

## 2017-11-04 DIAGNOSIS — J449 Chronic obstructive pulmonary disease, unspecified: Secondary | ICD-10-CM | POA: Diagnosis not present

## 2018-01-26 DIAGNOSIS — H524 Presbyopia: Secondary | ICD-10-CM | POA: Diagnosis not present

## 2018-01-26 DIAGNOSIS — Z0100 Encounter for examination of eyes and vision without abnormal findings: Secondary | ICD-10-CM | POA: Diagnosis not present

## 2018-02-04 DIAGNOSIS — G894 Chronic pain syndrome: Secondary | ICD-10-CM | POA: Diagnosis not present

## 2018-02-04 DIAGNOSIS — J449 Chronic obstructive pulmonary disease, unspecified: Secondary | ICD-10-CM | POA: Diagnosis not present

## 2018-02-04 DIAGNOSIS — Z6820 Body mass index (BMI) 20.0-20.9, adult: Secondary | ICD-10-CM | POA: Diagnosis not present

## 2018-02-04 DIAGNOSIS — M1991 Primary osteoarthritis, unspecified site: Secondary | ICD-10-CM | POA: Diagnosis not present

## 2018-02-04 DIAGNOSIS — Z23 Encounter for immunization: Secondary | ICD-10-CM | POA: Diagnosis not present

## 2018-02-12 ENCOUNTER — Encounter: Payer: Self-pay | Admitting: Gastroenterology

## 2018-03-25 ENCOUNTER — Other Ambulatory Visit (HOSPITAL_COMMUNITY): Payer: Self-pay | Admitting: Physician Assistant

## 2018-03-25 ENCOUNTER — Ambulatory Visit (HOSPITAL_COMMUNITY)
Admission: RE | Admit: 2018-03-25 | Discharge: 2018-03-25 | Disposition: A | Discharge status: Home / Self Care | Payer: Medicare Other | Source: Ambulatory Visit | Attending: Physician Assistant | Admitting: Physician Assistant

## 2018-03-25 DIAGNOSIS — Z1389 Encounter for screening for other disorder: Secondary | ICD-10-CM | POA: Diagnosis not present

## 2018-03-25 DIAGNOSIS — M25551 Pain in right hip: Secondary | ICD-10-CM

## 2018-03-25 DIAGNOSIS — Z6820 Body mass index (BMI) 20.0-20.9, adult: Secondary | ICD-10-CM | POA: Diagnosis not present

## 2018-03-25 DIAGNOSIS — S79911A Unspecified injury of right hip, initial encounter: Secondary | ICD-10-CM | POA: Diagnosis not present

## 2018-05-04 DIAGNOSIS — K219 Gastro-esophageal reflux disease without esophagitis: Secondary | ICD-10-CM | POA: Diagnosis not present

## 2018-05-04 DIAGNOSIS — M1991 Primary osteoarthritis, unspecified site: Secondary | ICD-10-CM | POA: Diagnosis not present

## 2018-05-04 DIAGNOSIS — M2550 Pain in unspecified joint: Secondary | ICD-10-CM | POA: Diagnosis not present

## 2018-05-04 DIAGNOSIS — G894 Chronic pain syndrome: Secondary | ICD-10-CM | POA: Diagnosis not present

## 2018-05-04 DIAGNOSIS — Z681 Body mass index (BMI) 19 or less, adult: Secondary | ICD-10-CM | POA: Diagnosis not present

## 2018-05-04 DIAGNOSIS — J449 Chronic obstructive pulmonary disease, unspecified: Secondary | ICD-10-CM | POA: Diagnosis not present

## 2018-06-08 ENCOUNTER — Other Ambulatory Visit (HOSPITAL_COMMUNITY): Payer: Self-pay | Admitting: Internal Medicine

## 2018-06-08 ENCOUNTER — Other Ambulatory Visit: Payer: Self-pay | Admitting: Internal Medicine

## 2018-06-08 DIAGNOSIS — Z6820 Body mass index (BMI) 20.0-20.9, adult: Secondary | ICD-10-CM | POA: Diagnosis not present

## 2018-06-08 DIAGNOSIS — Z0001 Encounter for general adult medical examination with abnormal findings: Secondary | ICD-10-CM | POA: Diagnosis not present

## 2018-06-08 DIAGNOSIS — M1991 Primary osteoarthritis, unspecified site: Secondary | ICD-10-CM | POA: Diagnosis not present

## 2018-06-08 DIAGNOSIS — R0989 Other specified symptoms and signs involving the circulatory and respiratory systems: Secondary | ICD-10-CM

## 2018-06-08 DIAGNOSIS — J449 Chronic obstructive pulmonary disease, unspecified: Secondary | ICD-10-CM | POA: Diagnosis not present

## 2018-06-08 DIAGNOSIS — G894 Chronic pain syndrome: Secondary | ICD-10-CM | POA: Diagnosis not present

## 2018-06-08 DIAGNOSIS — Z1389 Encounter for screening for other disorder: Secondary | ICD-10-CM | POA: Diagnosis not present

## 2018-06-15 ENCOUNTER — Ambulatory Visit (HOSPITAL_COMMUNITY)
Admission: RE | Admit: 2018-06-15 | Discharge: 2018-06-15 | Disposition: A | Discharge status: Home / Self Care | Payer: Medicare Other | Source: Ambulatory Visit | Attending: Internal Medicine | Admitting: Internal Medicine

## 2018-06-15 DIAGNOSIS — I6523 Occlusion and stenosis of bilateral carotid arteries: Secondary | ICD-10-CM | POA: Diagnosis not present

## 2018-06-15 DIAGNOSIS — R0989 Other specified symptoms and signs involving the circulatory and respiratory systems: Secondary | ICD-10-CM | POA: Diagnosis not present

## 2018-07-03 DIAGNOSIS — J449 Chronic obstructive pulmonary disease, unspecified: Secondary | ICD-10-CM | POA: Diagnosis not present

## 2018-07-03 DIAGNOSIS — Z1389 Encounter for screening for other disorder: Secondary | ICD-10-CM | POA: Diagnosis not present

## 2018-07-03 DIAGNOSIS — Z6821 Body mass index (BMI) 21.0-21.9, adult: Secondary | ICD-10-CM | POA: Diagnosis not present

## 2018-07-03 DIAGNOSIS — G894 Chronic pain syndrome: Secondary | ICD-10-CM | POA: Diagnosis not present

## 2018-07-03 DIAGNOSIS — Z0001 Encounter for general adult medical examination with abnormal findings: Secondary | ICD-10-CM | POA: Diagnosis not present

## 2018-07-03 DIAGNOSIS — M1991 Primary osteoarthritis, unspecified site: Secondary | ICD-10-CM | POA: Diagnosis not present

## 2018-07-03 DIAGNOSIS — Z6820 Body mass index (BMI) 20.0-20.9, adult: Secondary | ICD-10-CM | POA: Diagnosis not present

## 2018-07-03 DIAGNOSIS — K219 Gastro-esophageal reflux disease without esophagitis: Secondary | ICD-10-CM | POA: Diagnosis not present

## 2018-07-13 ENCOUNTER — Encounter: Payer: Self-pay | Admitting: Vascular Surgery

## 2018-07-13 ENCOUNTER — Ambulatory Visit (INDEPENDENT_AMBULATORY_CARE_PROVIDER_SITE_OTHER): Payer: Medicare HMO | Admitting: Vascular Surgery

## 2018-07-13 VITALS — BP 138/96 | HR 100 | Temp 99.5°F | Resp 18 | Wt 128.8 lb

## 2018-07-13 DIAGNOSIS — I6501 Occlusion and stenosis of right vertebral artery: Secondary | ICD-10-CM

## 2018-07-13 NOTE — Progress Notes (Signed)
[facility]ist of [facility] office  Patient name: Jamie Lam MRN: [id number] DOB: [birth year] Sex: male  REASON FOR CONSULT: Evaluation recent carotid duplex   HPI: Jamie Lam is a 63 y.o. male, who is today for discussion of recent carotid duplex.  He was found to have carotid bruit and underwent carotid duplex at [facility] on 06/15/2018.  Revealed irregular flow in his right vertebral artery.  He is seen today for further discussion.  It showed moderate plaque in the internal carotid with no evidence of stenosis.  Patient specifically denies any prior focal neurologic events.  He does report occasional orthostatic hypotension with some dizziness.  No symptoms typical of vertebrobasilar insufficiency. He is a long-term cigarette smoker.  He has no history of cardiac disease.  Does report some left sided chest pain and is scheduled for further cardiac work-up. Past Medical History:  Diagnosis Date  . Asthma   . COPD (chronic obstructive pulmonary disease) (HCC)     Family History  Problem Relation Age of Onset  . Heart failure Mother   . Cancer Father     SOCIAL HISTORY: Social History   Socioeconomic History  . Marital status: Married    Spouse name: Not on file  . Number of children: Not on file  . Years of education: Not on file  . Highest education level: Not on file  Occupational History  . Not on file  Social Needs  . Financial resource strain: Not on file  . Food insecurity:    Worry: Not on file    Inability: Not on file  . Transportation needs:    Medical: Not on file    Non-medical: Not on file  Tobacco Use  . Smoking status: Current Every Day Smoker    Packs/day: 1.50    Types: Cigarettes  . Smokeless tobacco: Never Used  Substance and Sexual Activity  . Alcohol use: No    Comment: ocassional beer  . Drug use: No  . Sexual activity: Yes  Lifestyle  . Physical activity:    Days per week: Not on file    Minutes per session: Not on file  . Stress: Not on file  Relationships  . Social connections:    Talks on phone: Not on file    Gets together: Not on file    Attends religious service: Not on file    Active member of club or organization: Not on file    Attends meetings of clubs or organizations: Not on file    Relationship status: Not on file  . Intimate partner violence:    Fear of current or ex partner: Not on file    Emotionally abused: Not on file    Physically abused: Not on file    Forced sexual activity: Not on file  Other Topics Concern  . Not on file  Social History Narrative  . Not on file    Allergies  Allergen Reactions  . Abilify [Aripiprazole] Other (See Comments)    "makes me feel crazy"  . Azithromycin Hives    Current Outpatient Medications  Medication Sig Dispense Refill  . albuterol (PROVENTIL) 2 MG tablet Take 2 mg by mouth 4 (four) times daily.    Marland Kitchen gabapentin (NEURONTIN) 800 MG tablet Take 1 tablet by mouth 4 (four) times daily.    Marland Kitchen HYDROcodone-acetaminophen (NORCO) 10-325 MG per tablet Take 1 tablet by mouth every 6 (six) hours as needed. pain    .  omeprazole (PRILOSEC) 40 MG capsule Take 40 mg by mouth daily.    . traMADol (ULTRAM) 50 MG tablet Take 100 mg by mouth every 6 (six) hours as needed. pain    . traZODone (DESYREL) 50 MG tablet Take 50 mg by mouth at bedtime.     No current facility-administered medications for this visit.     REVIEW OF SYSTEMS:  [X]  denotes positive finding, [ ]  denotes negative finding Cardiac  Comments:  Chest pain or chest pressure: x   Shortness of breath upon exertion: x   Short of breath when lying flat:    Irregular heart rhythm:        Vascular    Pain in calf, thigh, or hip brought on by ambulation: x   Pain in feet at night that wakes you up from your sleep:     Blood clot in your veins:    Leg swelling:         Pulmonary    Oxygen at home:    Productive cough:  x     Wheezing:  x       Neurologic    Sudden weakness in arms or legs:     Sudden numbness in arms or legs:  x   Sudden onset of difficulty speaking or slurred speech:    Temporary loss of vision in one eye:     Problems with dizziness:         Gastrointestinal    Blood in stool:     Vomited blood:         Genitourinary    Burning when urinating:     Blood in urine:        Psychiatric    Major depression:         Hematologic    Bleeding problems:    Problems with blood clotting too easily:        Skin    Rashes or ulcers:        Constitutional    Fever or chills:      PHYSICAL EXAM: Vitals:   07/13/18 1359 07/13/18 1403  BP: (!) 148/90 (!) 138/96  Pulse: 96 100  Resp: 18   Temp: 99.5 F (37.5 C)   TempSrc: Temporal   Weight: 128 lb 12.8 oz (58.4 kg)     GENERAL: The patient is a well-nourished male, in no acute distress. The vital signs are documented above. CARDIOVASCULAR: I do not hear bruits in his carotid arteries today.  He has 3+ radial and 1+ ulnar pulses bilaterally.  2+ dorsalis pedis pulses bilaterally. PULMONARY: There is good air exchange  ABDOMEN: Soft and non-tender.  No bruit noted.  MUSCULOSKELETAL: There are no major deformities or cyanosis. NEUROLOGIC: No focal weakness or paresthesias are detected. SKIN: There are no ulcers or rashes noted. PSYCHIATRIC: The patient has a normal affect.  DATA:  Duplex was reviewed.  This does show to and fro flow in the right vertebral artery and no significant carotid stenosis.  Blood pressure in our office today on the right arm is 138/90 and on the left arm is 148/90  MEDICAL ISSUES: Duplex carotid showing some irregular flow in the right vertebral artery.  The patient is asymptomatic from this.  No evidence of significant carotid disease.  Discussed this at length with the patient.  Would not recommend any further evaluation or follow-up.  I did discuss his cigarette smoking in the relationship to  progressive atherosclerotic change.  Reports that he would  like to quit mainly for his 61 year old daughter.  He was encouraged with this.  He will see us again on an basis.  We will proceed with cardiac work-up as scheduled   Larina Earthlyodd F. Oakley Kossman, MD [facility] [facility] Tel [phone] Pager [phone]

## 2018-07-17 ENCOUNTER — Encounter: Payer: Self-pay | Admitting: Cardiology

## 2018-07-17 ENCOUNTER — Other Ambulatory Visit: Payer: Self-pay

## 2018-07-17 ENCOUNTER — Ambulatory Visit: Payer: Medicare Other | Admitting: Cardiology

## 2018-07-17 VITALS — BP 128/78 | HR 88 | Ht 67.0 in | Wt 130.0 lb

## 2018-07-17 DIAGNOSIS — R011 Cardiac murmur, unspecified: Secondary | ICD-10-CM | POA: Diagnosis not present

## 2018-07-17 DIAGNOSIS — R0789 Other chest pain: Secondary | ICD-10-CM | POA: Diagnosis not present

## 2018-07-17 NOTE — Patient Instructions (Signed)
Medication Instructions:  Your physician recommends that you continue on your current medications as directed. Please refer to the Current Medication list given to you today.   Labwork: NONE  Testing/Procedures: Your physician has requested that you have an echocardiogram. Echocardiography is a painless test that uses sound waves to create images of your heart. It provides your doctor with information about the size and shape of your heart and how well your heart's chambers and valves are working. This procedure takes approximately one hour. There are no restrictions for this procedure.    Follow-Up: Your physician recommends that you schedule a follow-up appointment in: PENDING TEST RESULTS    Any Other Special Instructions Will Be Listed Below (If Applicable).     If you need a refill on your cardiac medications before your next appointment, please call your pharmacy.   

## 2018-07-17 NOTE — Progress Notes (Signed)
Clinical Summary Mr. Heyser is a 63 y.o.male seen as new consult, referred by Dr Sherwood Gambler for heart murmur   1. Heart murmur - reports told of heart mumur 25 years ago - chronic SOB he related to his COPD that stable. No edema.    2. Chest pain - pressure left ribs, 8/10 in severity. Can occur at any time. No other associated symptoms. Better with rest. Can be worst with deep breaths. Lasts 30-45 seconds, occurs daily   CAD risk factors: +smoker x 45 year, HL, mother MI early 63s.      Past Medical History:  Diagnosis Date  . Asthma   . COPD (chronic obstructive pulmonary disease) (HCC)      Allergies  Allergen Reactions  . Abilify [Aripiprazole] Other (See Comments)    "makes me feel crazy"  . Azithromycin Hives     Current Outpatient Medications  Medication Sig Dispense Refill  . albuterol (PROVENTIL) 2 MG tablet Take 2 mg by mouth 4 (four) times daily.    Marland Kitchen gabapentin (NEURONTIN) 800 MG tablet Take 1 tablet by mouth 4 (four) times daily.    Marland Kitchen HYDROcodone-acetaminophen (NORCO) 10-325 MG per tablet Take 1 tablet by mouth every 6 (six) hours as needed. pain    . omeprazole (PRILOSEC) 40 MG capsule Take 40 mg by mouth daily.    . traMADol (ULTRAM) 50 MG tablet Take 100 mg by mouth every 6 (six) hours as needed. pain    . traZODone (DESYREL) 50 MG tablet Take 50 mg by mouth at bedtime.     No current facility-administered medications for this visit.      Past Surgical History:  Procedure Laterality Date  . BACK SURGERY       Allergies  Allergen Reactions  . Abilify [Aripiprazole] Other (See Comments)    "makes me feel crazy"  . Azithromycin Hives      Family History  Problem Relation Age of Onset  . Heart failure Mother   . Cancer Father      Social History Mr. Medel reports that he has been smoking cigarettes. He has been smoking about 1.50 packs per day. He has never used smokeless tobacco. Mr. Smid reports no history of alcohol use.    Review of Systems CONSTITUTIONAL: No weight loss, fever, chills, weakness or fatigue.  HEENT: Eyes: No visual loss, blurred vision, double vision or yellow sclerae.No hearing loss, sneezing, congestion, runny nose or sore throat.  SKIN: No rash or itching.  CARDIOVASCULAR: per hpi RESPIRATORY: per hpi GASTROINTESTINAL: No anorexia, nausea, vomiting or diarrhea. No abdominal pain or blood.  GENITOURINARY: No burning on urination, no polyuria NEUROLOGICAL: No headache, dizziness, syncope, paralysis, ataxia, numbness or tingling in the extremities. No change in bowel or bladder control.  MUSCULOSKELETAL: No muscle, back pain, joint pain or stiffness.  LYMPHATICS: No enlarged nodes. No history of splenectomy.  PSYCHIATRIC: No history of depression or anxiety.  ENDOCRINOLOGIC: No reports of sweating, cold or heat intolerance. No polyuria or polydipsia.  Marland Kitchen   Physical Examination Today's Vitals   07/17/18 0937 07/17/18 0939  BP: 136/64 128/78  Pulse: 88   SpO2: 98%   Weight: 130 lb (59 kg)   Height: 5\' 7"  (1.702 m)    Body mass index is 20.36 kg/m.  Gen: resting comfortably, no acute distress HEENT: no scleral icterus, pupils equal round and reactive, no palptable cervical adenopathy,  CV: RRR, 3/6 systolic murmur rusb, bilateral carotid bruits Resp: Clear to auscultation bilaterally GI:  abdomen is soft, non-tender, non-distended, normal bowel sounds, no hepatosplenomegaly MSK: extremities are warm, no edema.  Skin: warm, no rash Neuro:  no focal deficits Psych: appropriate affect   Diagnostic Studies 06/2018 carotid US IMPRESSION: 1. Moderate amount of bilateral atherosclerotic plaque, not resulting in a hemodynamically significant stenosis within either internal carotid artery. 2. Antegrade flow demonstrated within right vertebral artery, though note is made of a pre steal waveform, as could be seen in the setting a right subclavian artery steal syndrome. Correlation with  acquisition of bilateral upper extremity blood pressures could be performed as indicated. If asymmetric, and patient reports posterior fossa symptoms with right upper extremity exercise, further evaluation with neck CTA could be performed as indicated.     Assessment and Plan   1. Heart murmur -exam suggests aortic stenosis, we will obtain echo to further evaluate.  2. Chest pain - multiple CAD risk factors _ EKG today shows SR, no acute ischemic changes - f/u echo results, likely plan for exercise nuclear stress. Would avoid lexiscan due to wheezing, if medication needed would use dobutamine if his echo does not show significant AS    F/u pending test results     Antoine Poche, M.D

## 2018-07-24 ENCOUNTER — Telehealth: Payer: Self-pay | Admitting: Internal Medicine

## 2018-07-24 NOTE — Telephone Encounter (Signed)
Echo sched for 3/27 I have reviewed records from Dr Wyline Mood Not emergent   With corona virus pandemic will reschule to May

## 2018-07-31 ENCOUNTER — Other Ambulatory Visit (HOSPITAL_COMMUNITY): Payer: Medicare HMO

## 2018-08-03 DIAGNOSIS — R252 Cramp and spasm: Secondary | ICD-10-CM | POA: Diagnosis not present

## 2018-08-03 DIAGNOSIS — G894 Chronic pain syndrome: Secondary | ICD-10-CM | POA: Diagnosis not present

## 2018-08-03 DIAGNOSIS — J449 Chronic obstructive pulmonary disease, unspecified: Secondary | ICD-10-CM | POA: Diagnosis not present

## 2018-08-03 DIAGNOSIS — M1991 Primary osteoarthritis, unspecified site: Secondary | ICD-10-CM | POA: Diagnosis not present

## 2018-09-01 DIAGNOSIS — G894 Chronic pain syndrome: Secondary | ICD-10-CM | POA: Diagnosis not present

## 2018-09-08 ENCOUNTER — Ambulatory Visit (HOSPITAL_COMMUNITY): Payer: Medicare HMO | Attending: Cardiology

## 2018-10-05 DIAGNOSIS — G894 Chronic pain syndrome: Secondary | ICD-10-CM | POA: Diagnosis not present

## 2018-10-05 DIAGNOSIS — G8929 Other chronic pain: Secondary | ICD-10-CM | POA: Diagnosis not present

## 2018-10-05 DIAGNOSIS — M5136 Other intervertebral disc degeneration, lumbar region: Secondary | ICD-10-CM | POA: Diagnosis not present

## 2018-10-05 DIAGNOSIS — M1991 Primary osteoarthritis, unspecified site: Secondary | ICD-10-CM | POA: Diagnosis not present

## 2018-11-10 DIAGNOSIS — G894 Chronic pain syndrome: Secondary | ICD-10-CM | POA: Diagnosis not present

## 2018-11-10 DIAGNOSIS — M5136 Other intervertebral disc degeneration, lumbar region: Secondary | ICD-10-CM | POA: Diagnosis not present

## 2018-11-10 DIAGNOSIS — M1991 Primary osteoarthritis, unspecified site: Secondary | ICD-10-CM | POA: Diagnosis not present

## 2018-12-07 DIAGNOSIS — G894 Chronic pain syndrome: Secondary | ICD-10-CM | POA: Diagnosis not present

## 2019-01-06 DIAGNOSIS — M1991 Primary osteoarthritis, unspecified site: Secondary | ICD-10-CM | POA: Diagnosis not present

## 2019-01-06 DIAGNOSIS — G894 Chronic pain syndrome: Secondary | ICD-10-CM | POA: Diagnosis not present

## 2019-01-06 DIAGNOSIS — Z23 Encounter for immunization: Secondary | ICD-10-CM | POA: Diagnosis not present

## 2019-01-06 DIAGNOSIS — Z681 Body mass index (BMI) 19 or less, adult: Secondary | ICD-10-CM | POA: Diagnosis not present

## 2019-02-03 DIAGNOSIS — G894 Chronic pain syndrome: Secondary | ICD-10-CM | POA: Diagnosis not present

## 2019-02-03 DIAGNOSIS — K219 Gastro-esophageal reflux disease without esophagitis: Secondary | ICD-10-CM | POA: Diagnosis not present

## 2019-02-03 DIAGNOSIS — J449 Chronic obstructive pulmonary disease, unspecified: Secondary | ICD-10-CM | POA: Diagnosis not present

## 2019-02-03 DIAGNOSIS — F331 Major depressive disorder, recurrent, moderate: Secondary | ICD-10-CM | POA: Diagnosis not present

## 2019-02-08 DIAGNOSIS — G894 Chronic pain syndrome: Secondary | ICD-10-CM | POA: Diagnosis not present

## 2019-03-06 DIAGNOSIS — E7849 Other hyperlipidemia: Secondary | ICD-10-CM | POA: Diagnosis not present

## 2019-03-06 DIAGNOSIS — M1991 Primary osteoarthritis, unspecified site: Secondary | ICD-10-CM | POA: Diagnosis not present

## 2019-03-06 DIAGNOSIS — J449 Chronic obstructive pulmonary disease, unspecified: Secondary | ICD-10-CM | POA: Diagnosis not present

## 2019-03-10 DIAGNOSIS — G894 Chronic pain syndrome: Secondary | ICD-10-CM | POA: Diagnosis not present

## 2019-04-05 DIAGNOSIS — E7849 Other hyperlipidemia: Secondary | ICD-10-CM | POA: Diagnosis not present

## 2019-04-05 DIAGNOSIS — J449 Chronic obstructive pulmonary disease, unspecified: Secondary | ICD-10-CM | POA: Diagnosis not present

## 2019-04-05 DIAGNOSIS — M1991 Primary osteoarthritis, unspecified site: Secondary | ICD-10-CM | POA: Diagnosis not present

## 2019-04-12 DIAGNOSIS — J329 Chronic sinusitis, unspecified: Secondary | ICD-10-CM | POA: Diagnosis not present

## 2019-04-12 DIAGNOSIS — G894 Chronic pain syndrome: Secondary | ICD-10-CM | POA: Diagnosis not present

## 2019-05-06 DIAGNOSIS — G894 Chronic pain syndrome: Secondary | ICD-10-CM | POA: Diagnosis not present

## 2019-05-06 DIAGNOSIS — F4542 Pain disorder with related psychological factors: Secondary | ICD-10-CM | POA: Diagnosis not present

## 2019-05-06 DIAGNOSIS — J449 Chronic obstructive pulmonary disease, unspecified: Secondary | ICD-10-CM | POA: Diagnosis not present

## 2019-05-06 DIAGNOSIS — Z720 Tobacco use: Secondary | ICD-10-CM | POA: Diagnosis not present

## 2019-05-10 DIAGNOSIS — G894 Chronic pain syndrome: Secondary | ICD-10-CM | POA: Diagnosis not present

## 2019-06-01 DIAGNOSIS — Z681 Body mass index (BMI) 19 or less, adult: Secondary | ICD-10-CM | POA: Diagnosis not present

## 2019-06-01 DIAGNOSIS — J329 Chronic sinusitis, unspecified: Secondary | ICD-10-CM | POA: Diagnosis not present

## 2019-06-01 DIAGNOSIS — J209 Acute bronchitis, unspecified: Secondary | ICD-10-CM | POA: Diagnosis not present

## 2019-06-01 DIAGNOSIS — G894 Chronic pain syndrome: Secondary | ICD-10-CM | POA: Diagnosis not present

## 2019-06-06 DIAGNOSIS — J449 Chronic obstructive pulmonary disease, unspecified: Secondary | ICD-10-CM | POA: Diagnosis not present

## 2019-06-06 DIAGNOSIS — Z720 Tobacco use: Secondary | ICD-10-CM | POA: Diagnosis not present

## 2019-06-06 DIAGNOSIS — M1991 Primary osteoarthritis, unspecified site: Secondary | ICD-10-CM | POA: Diagnosis not present

## 2019-07-04 DIAGNOSIS — Z720 Tobacco use: Secondary | ICD-10-CM | POA: Diagnosis not present

## 2019-07-04 DIAGNOSIS — M1991 Primary osteoarthritis, unspecified site: Secondary | ICD-10-CM | POA: Diagnosis not present

## 2019-07-04 DIAGNOSIS — J449 Chronic obstructive pulmonary disease, unspecified: Secondary | ICD-10-CM | POA: Diagnosis not present

## 2019-07-06 DIAGNOSIS — J329 Chronic sinusitis, unspecified: Secondary | ICD-10-CM | POA: Diagnosis not present

## 2019-07-06 DIAGNOSIS — I10 Essential (primary) hypertension: Secondary | ICD-10-CM | POA: Diagnosis not present

## 2019-07-06 DIAGNOSIS — G894 Chronic pain syndrome: Secondary | ICD-10-CM | POA: Diagnosis not present

## 2019-08-04 DIAGNOSIS — M1991 Primary osteoarthritis, unspecified site: Secondary | ICD-10-CM | POA: Diagnosis not present

## 2019-08-04 DIAGNOSIS — Z720 Tobacco use: Secondary | ICD-10-CM | POA: Diagnosis not present

## 2019-08-04 DIAGNOSIS — J449 Chronic obstructive pulmonary disease, unspecified: Secondary | ICD-10-CM | POA: Diagnosis not present

## 2019-08-05 DIAGNOSIS — G894 Chronic pain syndrome: Secondary | ICD-10-CM | POA: Diagnosis not present

## 2019-08-17 ENCOUNTER — Other Ambulatory Visit (HOSPITAL_COMMUNITY): Payer: Self-pay | Admitting: Family Medicine

## 2019-08-17 ENCOUNTER — Other Ambulatory Visit: Payer: Self-pay

## 2019-08-17 ENCOUNTER — Ambulatory Visit (HOSPITAL_COMMUNITY)
Admission: RE | Admit: 2019-08-17 | Discharge: 2019-08-17 | Disposition: A | Discharge status: Home / Self Care | Payer: Medicare HMO | Source: Ambulatory Visit | Attending: Family Medicine | Admitting: Family Medicine

## 2019-08-17 DIAGNOSIS — J449 Chronic obstructive pulmonary disease, unspecified: Secondary | ICD-10-CM | POA: Diagnosis not present

## 2019-08-17 DIAGNOSIS — Z1389 Encounter for screening for other disorder: Secondary | ICD-10-CM

## 2019-08-17 DIAGNOSIS — S299XXA Unspecified injury of thorax, initial encounter: Secondary | ICD-10-CM | POA: Diagnosis not present

## 2019-08-17 DIAGNOSIS — Z681 Body mass index (BMI) 19 or less, adult: Secondary | ICD-10-CM | POA: Diagnosis not present

## 2019-08-24 ENCOUNTER — Encounter: Payer: Self-pay | Admitting: Gastroenterology

## 2019-08-31 DIAGNOSIS — H5210 Myopia, unspecified eye: Secondary | ICD-10-CM | POA: Diagnosis not present

## 2019-08-31 DIAGNOSIS — Z0100 Encounter for examination of eyes and vision without abnormal findings: Secondary | ICD-10-CM | POA: Diagnosis not present

## 2019-09-03 DIAGNOSIS — Z720 Tobacco use: Secondary | ICD-10-CM | POA: Diagnosis not present

## 2019-09-03 DIAGNOSIS — J449 Chronic obstructive pulmonary disease, unspecified: Secondary | ICD-10-CM | POA: Diagnosis not present

## 2019-09-03 DIAGNOSIS — M1991 Primary osteoarthritis, unspecified site: Secondary | ICD-10-CM | POA: Diagnosis not present

## 2019-09-06 DIAGNOSIS — Z125 Encounter for screening for malignant neoplasm of prostate: Secondary | ICD-10-CM | POA: Diagnosis not present

## 2019-09-06 DIAGNOSIS — E7849 Other hyperlipidemia: Secondary | ICD-10-CM | POA: Diagnosis not present

## 2019-09-06 DIAGNOSIS — Z1389 Encounter for screening for other disorder: Secondary | ICD-10-CM | POA: Diagnosis not present

## 2019-09-06 DIAGNOSIS — I10 Essential (primary) hypertension: Secondary | ICD-10-CM | POA: Diagnosis not present

## 2019-09-06 DIAGNOSIS — F329 Major depressive disorder, single episode, unspecified: Secondary | ICD-10-CM | POA: Diagnosis not present

## 2019-09-06 DIAGNOSIS — E782 Mixed hyperlipidemia: Secondary | ICD-10-CM | POA: Diagnosis not present

## 2019-09-06 DIAGNOSIS — G894 Chronic pain syndrome: Secondary | ICD-10-CM | POA: Diagnosis not present

## 2019-09-06 DIAGNOSIS — M1991 Primary osteoarthritis, unspecified site: Secondary | ICD-10-CM | POA: Diagnosis not present

## 2019-09-06 DIAGNOSIS — Z0001 Encounter for general adult medical examination with abnormal findings: Secondary | ICD-10-CM | POA: Diagnosis not present

## 2019-09-06 DIAGNOSIS — Z681 Body mass index (BMI) 19 or less, adult: Secondary | ICD-10-CM | POA: Diagnosis not present

## 2019-09-17 ENCOUNTER — Ambulatory Visit: Payer: Medicare HMO | Admitting: Gastroenterology

## 2019-09-17 ENCOUNTER — Encounter: Payer: Self-pay | Admitting: Gastroenterology

## 2019-09-17 DIAGNOSIS — Z1211 Encounter for screening for malignant neoplasm of colon: Secondary | ICD-10-CM | POA: Insufficient documentation

## 2019-09-17 NOTE — Patient Instructions (Signed)
We are arranging a routine screening colonoscopy in the near future.  Further recommendations to follow!   It was a pleasure to see you today. I want to create trusting relationships with patients to provide genuine, compassionate, and quality care. I value your feedback. If you receive a survey regarding your visit,  I greatly appreciate you taking time to fill this out.   Gelene Mink, PhD, ANP-BC [facility]

## 2019-09-17 NOTE — Progress Notes (Signed)
Primary Care Physician:  Jamie School, MD  Referring Physician: Dr. Gerarda Lam  Primary Gastroenterologist:  Previously Dr. Oneida Lam  Chief Complaint  Patient presents with  . Colonoscopy    HPI:   Jamie Lam is a 64 y.o. male presenting today at the request of Dr. Gerarda Lam for screening colonoscopy. Last colonoscopy in 2009 without polyps. No family history of colorectal cancer or polyps.   Prilosec daily for GERD. Chronic narcotics for back and hip pain. No weight loss or lack of appetite. No dysphagia. Denies constipation or diarrhea. No changes in bowel habits. No overt GI bleeding.   Past Medical History:  Diagnosis Date  . Asthma   . 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 COPD (chronic obstructive pulmonary disease) ([place])   . GERD (gastroesophageal reflux disease)     Past Surgical History:  Procedure Laterality Date  . BACK SURGERY    . COLONOSCOPY  2009   Dr. Oneida Lam: rare sigmoid diverticula, otherwise no polyps, masses, inflammatory changes, or AVMS.   Marland Kitchen ESOPHAGOGASTRODUODENOSCOPY  2007   non-erosive antral gastirtis, positive H.pylori serology, reportedly treated with Prevpac    Current Outpatient Medications  Medication Sig Dispense Refill  . albuterol (PROVENTIL) 2 MG tablet Take 2 mg by mouth 4 (four) times daily.    Marland Kitchen aspirin 81 MG chewable tablet Chew by mouth daily.    Marland Kitchen atorvastatin (LIPITOR) 40 MG tablet     . Fluticasone-Umeclidin-Vilant (TRELEGY ELLIPTA) 100-62.5-25 MCG/INH AEPB Inhale into the lungs daily.    Marland Kitchen gabapentin (NEURONTIN) 800 MG tablet Take 1 tablet by mouth as needed.     Marland Kitchen HYDROcodone-acetaminophen (NORCO) 10-325 MG per tablet Take 1 tablet by mouth every 6 (six) hours as needed. pain    . omeprazole (PRILOSEC) 40 MG capsule Take 40 mg by mouth daily.    . traMADol (ULTRAM) 50 MG tablet Take 100 mg by mouth every 6 (six) hours as needed. pain    . traZODone (DESYREL) 50 MG tablet Take 50 mg by mouth at  bedtime.     No current facility-administered medications for this visit.    Allergies as of 09/17/2019 - Review Complete 09/17/2019  Allergen Reaction Noted  . Abilify [aripiprazole] Other (See Comments) 07/03/2013  . Azithromycin Hives 07/03/2013    Family History  Problem Relation Age of Onset  . Heart failure Mother   . Cancer Father   . Colon cancer Neg Hx   . Colon polyps Neg Hx     Social History   Socioeconomic History  . Marital status: Married    Spouse name: Not on file  . Number of children: Not on file  . Years of education: Not on file  . Highest education level: Not on file  Occupational History  . Not on file  Tobacco Use  . Smoking status: Current Every Day Smoker    Packs/day: 1.50    Types: Cigarettes  . Smokeless tobacco: Never Used  Substance and Sexual Activity  . Alcohol use: No  . Drug use: No  . Sexual activity: Yes  Other Topics Concern  . Not on file  Social History Narrative  . Not on file   Social Determinants of Health   Financial Resource Strain:   . Difficulty of Paying Living Expenses:   Food Insecurity:   . Worried About Charity fundraiser in the Last Year:   . [place] in the Last  Year:   Transportation Needs:   . Freight forwarder (Medical):   Marland Kitchen Lack of Transportation (Non-Medical):   Physical Activity:   . Days of Exercise per Week:   . Minutes of Exercise per Session:   Stress:   . Feeling of Stress :   Social Connections:   . Frequency of Communication with Friends and Family:   . Frequency of Social Gatherings with Friends and Family:   . Attends Religious Services:   . Active Member of Clubs or Organizations:   . Attends Banker Meetings:   Marland Kitchen Marital Status:   Intimate Partner Violence:   . Fear of Current or Ex-Partner:   . Emotionally Abused:   Marland Kitchen Physically Abused:   . Sexually Abused:     Review of Systems: Gen: Denies any fever, chills, fatigue, weight loss, lack of  appetite.  CV: Denies chest pain, heart palpitations, peripheral edema, syncope.  Resp: Denies shortness of breath at rest or with exertion. Denies wheezing or cough.  GI: see HPI GU : Denies urinary burning, urinary frequency, urinary hesitancy MS: Denies joint pain, muscle weakness, cramps, or limitation of movement.  Derm: Denies rash, itching, dry skin Psych: Denies depression, anxiety, memory loss, and confusion Heme: Denies bruising, bleeding, and enlarged lymph nodes.  Physical Exam: BP 123/71   Pulse 77   Temp (!) 97.3 F (36.3 C) (Temporal)   Ht 5\' 8"  (1.727 m)   Wt 126 lb (57.2 kg)   BMI 19.16 kg/m  General:   Alert and oriented. Pleasant and cooperative. Well-nourished and well-developed.  Head:  Normocephalic and atraumatic. Eyes:  Without icterus, sclera clear and conjunctiva pink.  Ears:  Normal auditory acuity. Mouth:  Mask in place Lungs:  Scattered rhonchi, coarse bases, expiratory wheeze Heart:  S1, S2 present with soft murmur Abdomen:  +BS, soft, non-tender and non-distended. No HSM noted. No guarding or rebound. No masses appreciated.  Rectal:  Deferred  Msk:  Symmetrical without gross deformities. Normal posture. Extremities:  Without edema. Neurologic:  Alert and  oriented x4;  grossly normal neurologically. Skin:  Intact without significant lesions or rashes. Psych:  Alert and cooperative. Normal mood and affect.  ASSESSMENT: Jamie Lam is a 64 y.o. male presenting today with need for screening colonoscopy, last completed in 2009 and normal. Due to chronic narcotics for back pain, he will need to be done with Propofol. He has no concerning lower or upper GI signs/symptoms. GERD remains well-managed on daily PPI.    PLAN:  Proceed with TCS with Dr. 2010 in near future using PROPOFOL: the risks, benefits, and alternatives have been discussed with the patient in detail. The patient states understanding and desires to proceed.  Further  recommendations to follow  Jamie Gauss, PhD, ANP-BC [facility]

## 2019-09-20 ENCOUNTER — Encounter: Payer: Self-pay | Admitting: Physician Assistant

## 2019-09-20 ENCOUNTER — Ambulatory Visit: Payer: Medicare HMO | Admitting: Physician Assistant

## 2019-09-20 ENCOUNTER — Other Ambulatory Visit: Payer: Self-pay

## 2019-09-20 VITALS — BP 134/76 | HR 87 | Temp 99.1°F | Ht 68.0 in | Wt 122.0 lb

## 2019-09-20 DIAGNOSIS — R079 Chest pain, unspecified: Secondary | ICD-10-CM | POA: Diagnosis not present

## 2019-09-20 DIAGNOSIS — J449 Chronic obstructive pulmonary disease, unspecified: Secondary | ICD-10-CM | POA: Diagnosis not present

## 2019-09-20 DIAGNOSIS — Z8249 Family history of ischemic heart disease and other diseases of the circulatory system: Secondary | ICD-10-CM

## 2019-09-20 DIAGNOSIS — R011 Cardiac murmur, unspecified: Secondary | ICD-10-CM | POA: Diagnosis not present

## 2019-09-20 DIAGNOSIS — Z720 Tobacco use: Secondary | ICD-10-CM

## 2019-09-20 DIAGNOSIS — I6523 Occlusion and stenosis of bilateral carotid arteries: Secondary | ICD-10-CM

## 2019-09-20 NOTE — Patient Instructions (Addendum)
Medication Instructions:  Your physician recommends that you continue on your current medications as directed. Please refer to the Current Medication list given to you today.  *If you need a refill on your cardiac medications before your next appointment, please call your pharmacy*   Lab Work: NONE   If you have labs (blood work) drawn today and your tests are completely normal, you will receive your results only by: Marland Kitchen MyChart Message (if you have MyChart) OR . A paper copy in the mail If you have any lab test that is abnormal or we need to change your treatment, we will call you to review the results.   Testing/Procedures: Your physician has requested that you have an echocardiogram. Echocardiography is a painless test that uses sound waves to create images of your heart. It provides your doctor with information about the size and shape of your heart and how well your heart's chambers and valves are working. This procedure takes approximately one hour. There are no restrictions for this procedure.  Your physician has requested that you have a carotid duplex. This test is an ultrasound of the carotid arteries in your neck. It looks at blood flow through these arteries that supply the brain with blood. Allow one hour for this exam. There are no restrictions or special instructions.    Follow-Up: At [facility], you and your health needs are our priority.  As part of our continuing mission to provide you with exceptional heart care, we have created designated Provider Care Teams.  These Care Teams include your primary Cardiologist (physician) and Advanced Practice Providers (APPs -  Physician Assistants and Nurse Practitioners) who all work together to provide you with the care you need, when you need it.  We recommend signing up for the patient portal called "MyChart".  Sign up information is provided on this After Visit Summary.  MyChart is used to connect with patients for Virtual Visits  (Telemedicine).  Patients are able to view lab/test results, encounter notes, upcoming appointments, etc.  Non-urgent messages can be sent to your provider as well.   To learn more about what you can do with MyChart, go to [email or web address].    Your next appointment:   2 month(s)  The format for your next appointment:   In Person  Provider:   Carlyle Dolly, MD   Other Instructions Thank you for choosing [facility]!   COVID-19 Vaccine Information can be found at: [email or web address].uk For questions related to vaccine distribution or appointments, please email [email or web address] or call [phone].    Steps to Quit Smoking Smoking tobacco is the leading cause of preventable death. It can affect almost every organ in the body. Smoking puts you and people around you at risk for many serious, long-lasting (chronic) diseases. Quitting smoking can be hard, but it is one of the best things that you can do for your health. It is never too late to quit. How do I get ready to quit? When you decide to quit smoking, make a plan to help you succeed. Before you quit:  Pick a date to quit. Set a date within the next 2 weeks to give you time to prepare.  Write down the reasons why you are quitting. Keep this list in places where you will see it often.  Tell your family, friends, and co-workers that you are quitting. Their support is important.  Talk with your doctor about the choices that may help you quit.  Find out if your health insurance  will pay for these treatments.  Know the people, places, things, and activities that make you want to smoke (triggers). Avoid them. What first steps can I take to quit smoking?  Throw away all cigarettes at home, at work, and in your car.  Throw away the things that you use when you smoke, such as ashtrays and lighters.  Clean your car. Make sure to empty the  ashtray.  Clean your home, including curtains and carpets. What can I do to help me quit smoking? Talk with your doctor about taking medicines and seeing a counselor at the same time. You are more likely to succeed when you do both.  If you are pregnant or breastfeeding, talk with your doctor about counseling or other ways to quit smoking. Do not take medicine to help you quit smoking unless your doctor tells you to do so. To quit smoking: Quit right away  Quit smoking totally, instead of slowly cutting back on how much you smoke over a period of time.  Go to counseling. You are more likely to quit if you go to counseling sessions regularly. Take medicine You may take medicines to help you quit. Some medicines need a prescription, and some you can buy over-the-counter. Some medicines may contain a drug called nicotine to replace the nicotine in cigarettes. Medicines may:  Help you to stop having the desire to smoke (cravings).  Help to stop the problems that come when you stop smoking (withdrawal symptoms). Your doctor may ask you to use:  Nicotine patches, gum, or lozenges.  Nicotine inhalers or sprays.  Non-nicotine medicine that is taken by mouth. Find resources Find resources and other ways to help you quit smoking and remain smoke-free after you quit. These resources are most helpful when you use them often. They include:  Online chats with a Veterinary surgeon.  Phone quitlines.  Printed Materials engineer.  Support groups or group counseling.  Text messaging programs.  Mobile phone apps. Use apps on your mobile phone or tablet that can help you stick to your quit plan. There are many free apps for mobile phones and tablets as well as websites. Examples include Quit Guide from the [employer] and smokefree.gov  What things can I do to make it easier to quit?   Talk to your family and friends. Ask them to support and encourage you.  Call a phone quitline (1-800-QUIT-NOW), reach out  to support groups, or work with a Veterinary surgeon.  Ask people who smoke to not smoke around you.  Avoid places that make you want to smoke, such as: ? Bars. ? Parties. ? Smoke-break areas at work.  Spend time with people who do not smoke.  Lower the stress in your life. Stress can make you want to smoke. Try these things to help your stress: ? Getting regular exercise. ? Doing deep-breathing exercises. ? Doing yoga. ? Meditating. ? Doing a body scan. To do this, close your eyes, focus on one area of your body at a time from head to toe. Notice which parts of your body are tense. Try to relax the muscles in those areas. How will I feel when I quit smoking? Day 1 to 3 weeks Within the first 24 hours, you may start to have some problems that come from quitting tobacco. These problems are very bad 2-3 days after you quit, but they do not often last for more than 2-3 weeks. You may get these symptoms:  Mood swings.  Feeling restless, nervous, angry, or annoyed.  Trouble concentrating.  Dizziness.  Strong desire for high-sugar foods and nicotine.  Weight gain.  Trouble pooping (constipation).  Feeling like you may vomit (nausea).  Coughing or a sore throat.  Changes in how the medicines that you take for other issues work in your body.  Depression.  Trouble sleeping (insomnia). Week 3 and afterward After the first 2-3 weeks of quitting, you may start to notice more positive results, such as:  Better sense of smell and taste.  Less coughing and sore throat.  Slower heart rate.  Lower blood pressure.  Clearer skin.  Better breathing.  Fewer sick days. Quitting smoking can be hard. Do not give up if you fail the first time. Some people need to try a few times before they succeed. Do your best to stick to your quit plan, and talk with your doctor if you have any questions or concerns. Summary  Smoking tobacco is the leading cause of preventable death. Quitting smoking  can be hard, but it is one of the best things that you can do for your health.  When you decide to quit smoking, make a plan to help you succeed.  Quit smoking right away, not slowly over a period of time.  When you start quitting, seek help from your doctor, family, or friends. This information is not intended to replace advice given to you by your health care provider. Make sure you discuss any questions you have with your health care provider. Document Revised: 01/15/2019 Document Reviewed: 07/11/2018 Elsevier Patient Education  2020 [employer].

## 2019-09-20 NOTE — Progress Notes (Signed)
Cardiology Office Note    Date:  09/20/2019   ID:  Jamie Lam, DOB [birth year], MRN [id number]  PCP:  Elfredia Nevins, MD  Cardiologist: Dina Rich, MD EPS: None    Chief Complaint  Patient presents with  . Follow-up    History of Present Illness:  Jamie Lam is a 64 y.o. male with history of atypical chest pain, family history of CAD, COPD and tobacco abuse, HLD. He saw Dr. Wyline Mood 07/17/18 for a heart murmur that he's had for 25 yrs that was suspected to be AS. Echo ordered but never done because of COVID 19. Most likely would need stress test if echo didn't show significant AS. No lexiscan because of wheezing-dobutamine if he couldn't walk.  Patient has chronic dyspnea on exertion that is unchanged. Can walk to the end of a hall before he gets short of breath. Denies chest pain, dizziness, presyncope or palpitations. Smokes 1 1/2 ppd. chantix didn't help.   Has received covid19 vaccine.   Past Medical History:  Diagnosis Date  . Asthma   . 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 COPD (chronic obstructive pulmonary disease) (HCC)   . GERD (gastroesophageal reflux disease)     Past Surgical History:  Procedure Laterality Date  . BACK SURGERY    . COLONOSCOPY  2009   Dr. Darrick Penna: rare sigmoid diverticula, otherwise no polyps, masses, inflammatory changes, or AVMS.   Marland Kitchen ESOPHAGOGASTRODUODENOSCOPY  2007   non-erosive antral gastirtis, positive H.pylori serology, reportedly treated with Prevpac    Current Medications: Current Meds  Medication Sig  . albuterol (PROVENTIL) 2 MG tablet Take 2 mg by mouth 4 (four) times daily.  Marland Kitchen aspirin 81 MG chewable tablet Chew by mouth daily.  Marland Kitchen atorvastatin (LIPITOR) 40 MG tablet Take 40 mg by mouth daily.   Marland Kitchen co-enzyme Q-10 30 MG capsule Take 30 mg by mouth daily.  . Fluticasone-Umeclidin-Vilant (TRELEGY ELLIPTA) 100-62.5-25 MCG/INH AEPB Inhale into the lungs daily.  Marland Kitchen gabapentin  (NEURONTIN) 800 MG tablet Take 1 tablet by mouth as needed.   Marland Kitchen HYDROcodone-acetaminophen (NORCO) 10-325 MG per tablet Take 1 tablet by mouth every 6 (six) hours as needed. pain  . omeprazole (PRILOSEC) 40 MG capsule Take 40 mg by mouth daily.  . traMADol (ULTRAM) 50 MG tablet Take 100 mg by mouth every 6 (six) hours as needed. pain  . traZODone (DESYREL) 50 MG tablet Take 50 mg by mouth at bedtime.  Marland Kitchen zolpidem (AMBIEN) 10 MG tablet Take 10 mg by mouth at bedtime.      Allergies:   Abilify [aripiprazole] and Azithromycin   Social History   Socioeconomic History  . Marital status: Married    Spouse name: Not on file  . Number of children: Not on file  . Years of education: Not on file  . Highest education level: Not on file  Occupational History  . Not on file  Tobacco Use  . Smoking status: Current Every Day Smoker    Packs/day: 1.50    Types: Cigarettes  . Smokeless tobacco: Never Used  Substance and Sexual Activity  . Alcohol use: No  . Drug use: No  . Sexual activity: Yes  Other Topics Concern  . Not on file  Social History Narrative  . Not on file   Social Determinants of Health   Financial Resource Strain:   . Difficulty of Paying Living Expenses:   Food Insecurity:   . Worried  About Running Out of Food in the Last Year:   . [place] in the Last Year:   Transportation Needs:   . Lack of Transportation (Medical):   Marland Kitchen Lack of Transportation (Non-Medical):   Physical Activity:   . Days of Exercise per Week:   . Minutes of Exercise per Session:   Stress:   . Feeling of Stress :   Social Connections:   . Frequency of Communication with Friends and Family:   . Frequency of Social Gatherings with Friends and Family:   . Attends Religious Services:   . Active Member of Clubs or Organizations:   . Attends Archivist Meetings:   Marland Kitchen Marital Status:      Family History:  The patient's family history includes Cancer in his father; Heart failure in his  mother.   ROS:   Please see the history of present illness.    ROS All other systems reviewed and are negative.   PHYSICAL EXAM:   VS:  BP 134/76   Pulse 87   Temp 99.1 F (37.3 C)   Ht 5\' 8"  (1.727 m)   Wt 122 lb (55.3 kg)   SpO2 96%   BMI 18.55 kg/m   Physical Exam  GEN: Thin, looks older than stated age, in no acute distress  Neck: no JVD, carotid bruits, or masses Cardiac:RRR; 2/6 systolic murmur at the left sternal border Respiratory: Decreased breath sounds with diffuse wheezing and rhonchi throughout lung fields  GI: soft, nontender, nondistended, + BS Ext: without cyanosis, clubbing, or edema, Good distal pulses bilaterally Neuro:  Alert and Oriented x 3 Psych: euthymic mood, full affect  Wt Readings from Last 3 Encounters:  09/20/19 122 lb (55.3 kg)  09/17/19 126 lb (57.2 kg)  07/17/18 130 lb (59 kg)      Studies/Labs Reviewed:   EKG:  EKG is not ordered today.    Recent Labs: No results found for requested labs within last 8760 hours.   Lipid Panel No results found for: CHOL, TRIG, HDL, CHOLHDL, VLDL, LDLCALC, LDLDIRECT  Additional studies/ records that were reviewed today include:    06/2018 carotid US IMPRESSION: 1. Moderate amount of bilateral atherosclerotic plaque, not resulting in a hemodynamically significant stenosis within either internal carotid artery. 2. Antegrade flow demonstrated within right vertebral artery, though note is made of a pre steal waveform, as could be seen in the setting a right subclavian artery steal syndrome. Correlation with acquisition of bilateral upper extremity blood pressures could be performed as indicated. If asymmetric, and patient reports posterior fossa symptoms with right upper extremity exercise, further evaluation with neck CTA could be performed as indicated.            ASSESSMENT:    1. Heart murmur   2. Chest pain, unspecified type   3. Tobacco abuse   4. Family history of early CAD   2.  Chronic obstructive pulmonary disease, unspecified COPD type ([place])   6. Bilateral carotid artery stenosis      PLAN:  In order of problems listed above:  Heart murmur-suggestive of AS-needs echo-not done during Covid.  Chronic dyspnea on exertion.  Will order.  Chest pain with multiple CV risk factors-needs stress test after echo(no lexiscan b/c of wheezing per Dr. Harl Bowie last year) patient not having chest pain at this time.  Will await echo and follow-up.  HLD labs recently done by PCP.  On atorvastatin  Tobacco abuse smoking cessation discussed.  No success with  Chantix.  He is willing to try nicotine patches.  Family history of CAD  Carotid plaque Dr. Carlena SaxFosco asking for repeat Dopplers to be ordered.  Will order  COPD with diffuse wheezing and rhonchi.  Has smoked over 50 years.  Last CT scan 2009.  Will refer to pulmonary to be followed closely.  Encouraged him to use his inhalers.  Medication Adjustments/Labs and Tests Ordered: Current medicines are reviewed at length with the patient today.  Concerns regarding medicines are outlined above.  Medication changes, Labs and Tests ordered today are listed in the Patient Instructions below. Patient Instructions  Medication Instructions:  Your physician recommends that you continue on your current medications as directed. Please refer to the Current Medication list given to you today.  *If you need a refill on your cardiac medications before your next appointment, please call your pharmacy*   Lab Work: NONE   If you have labs (blood work) drawn today and your tests are completely normal, you will receive your results only by: Marland Kitchen. MyChart Message (if you have MyChart) OR . A paper copy in the mail If you have any lab test that is abnormal or we need to change your treatment, we will call you to review the results.   Testing/Procedures: Your physician has requested that you have an echocardiogram. Echocardiography is a painless test  that uses sound waves to create images of your heart. It provides your doctor with information about the size and shape of your heart and how well your heart's chambers and valves are working. This procedure takes approximately one hour. There are no restrictions for this procedure.  Your physician has requested that you have a carotid duplex. This test is an ultrasound of the carotid arteries in your neck. It looks at blood flow through these arteries that supply the brain with blood. Allow one hour for this exam. There are no restrictions or special instructions.    Follow-Up: At [facility], you and your health needs are our priority.  As part of our continuing mission to provide you with exceptional heart care, we have created designated Provider Care Teams.  These Care Teams include your primary Cardiologist (physician) and Advanced Practice Providers (APPs -  Physician Assistants and Nurse Practitioners) who all work together to provide you with the care you need, when you need it.  We recommend signing up for the patient portal called "MyChart".  Sign up information is provided on this After Visit Summary.  MyChart is used to connect with patients for Virtual Visits (Telemedicine).  Patients are able to view lab/test results, encounter notes, upcoming appointments, etc.  Non-urgent messages can be sent to your provider as well.   To learn more about what you can do with MyChart, go to [email or web address].    Your next appointment:   2 month(s)  The format for your next appointment:   In Person  Provider:   Dina RichJonathan Branch, MD   Other Instructions Thank you for choosing [facility]!   COVID-19 Vaccine Information can be found at: [email or web address] For questions related to vaccine distribution or appointments, please email [email or web address] or call [phone].    Steps to Quit Smoking Smoking  tobacco is the leading cause of preventable death. It can affect almost every organ in the body. Smoking puts you and people around you at risk for many serious, long-lasting (chronic) diseases. Quitting smoking can be hard, but it is one of the best things that you can do for your health.  It is never too late to quit. How do I get ready to quit? When you decide to quit smoking, make a plan to help you succeed. Before you quit:  Pick a date to quit. Set a date within the next 2 weeks to give you time to prepare.  Write down the reasons why you are quitting. Keep this list in places where you will see it often.  Tell your family, friends, and co-workers that you are quitting. Their support is important.  Talk with your doctor about the choices that may help you quit.  Find out if your health insurance will pay for these treatments.  Know the people, places, things, and activities that make you want to smoke (triggers). Avoid them. What first steps can I take to quit smoking?  Throw away all cigarettes at home, at work, and in your car.  Throw away the things that you use when you smoke, such as ashtrays and lighters.  Clean your car. Make sure to empty the ashtray.  Clean your home, including curtains and carpets. What can I do to help me quit smoking? Talk with your doctor about taking medicines and seeing a counselor at the same time. You are more likely to succeed when you do both.  If you are pregnant or breastfeeding, talk with your doctor about counseling or other ways to quit smoking. Do not take medicine to help you quit smoking unless your doctor tells you to do so. To quit smoking: Quit right away  Quit smoking totally, instead of slowly cutting back on how much you smoke over a period of time.  Go to counseling. You are more likely to quit if you go to counseling sessions regularly. Take medicine You may take medicines to help you quit. Some medicines need a prescription,  and some you can buy over-the-counter. Some medicines may contain a drug called nicotine to replace the nicotine in cigarettes. Medicines may:  Help you to stop having the desire to smoke (cravings).  Help to stop the problems that come when you stop smoking (withdrawal symptoms). Your doctor may ask you to use:  Nicotine patches, gum, or lozenges.  Nicotine inhalers or sprays.  Non-nicotine medicine that is taken by mouth. Find resources Find resources and other ways to help you quit smoking and remain smoke-free after you quit. These resources are most helpful when you use them often. They include:  Online chats with a Veterinary surgeon.  Phone quitlines.  Printed Materials engineer.  Support groups or group counseling.  Text messaging programs.  Mobile phone apps. Use apps on your mobile phone or tablet that can help you stick to your quit plan. There are many free apps for mobile phones and tablets as well as websites. Examples include Quit Guide from the [employer] and smokefree.gov  What things can I do to make it easier to quit?   Talk to your family and friends. Ask them to support and encourage you.  Call a phone quitline (1-800-QUIT-NOW), reach out to support groups, or work with a Veterinary surgeon.  Ask people who smoke to not smoke around you.  Avoid places that make you want to smoke, such as: ? Bars. ? Parties. ? Smoke-break areas at work.  Spend time with people who do not smoke.  Lower the stress in your life. Stress can make you want to smoke. Try these things to help your stress: ? Getting regular exercise. ? Doing deep-breathing exercises. ? Doing yoga. ? Meditating. ? Doing a  body scan. To do this, close your eyes, focus on one area of your body at a time from head to toe. Notice which parts of your body are tense. Try to relax the muscles in those areas. How will I feel when I quit smoking? Day 1 to 3 weeks Within the first 24 hours, you may start to have some  problems that come from quitting tobacco. These problems are very bad 2-3 days after you quit, but they do not often last for more than 2-3 weeks. You may get these symptoms:  Mood swings.  Feeling restless, nervous, angry, or annoyed.  Trouble concentrating.  Dizziness.  Strong desire for high-sugar foods and nicotine.  Weight gain.  Trouble pooping (constipation).  Feeling like you may vomit (nausea).  Coughing or a sore throat.  Changes in how the medicines that you take for other issues work in your body.  Depression.  Trouble sleeping (insomnia). Week 3 and afterward After the first 2-3 weeks of quitting, you may start to notice more positive results, such as:  Better sense of smell and taste.  Less coughing and sore throat.  Slower heart rate.  Lower blood pressure.  Clearer skin.  Better breathing.  Fewer sick days. Quitting smoking can be hard. Do not give up if you fail the first time. Some people need to try a few times before they succeed. Do your best to stick to your quit plan, and talk with your doctor if you have any questions or concerns. Summary  Smoking tobacco is the leading cause of preventable death. Quitting smoking can be hard, but it is one of the best things that you can do for your health.  When you decide to quit smoking, make a plan to help you succeed.  Quit smoking right away, not slowly over a period of time.  When you start quitting, seek help from your doctor, family, or friends. This information is not intended to replace advice given to you by your health care provider. Make sure you discuss any questions you have with your health care provider. Document Revised: 01/15/2019 Document Reviewed: 07/11/2018 Elsevier Patient Education  [address]      Signed, Jacolyn Reedy, [place]  09/20/2019 1:11 PM    [facility] [address] [place], [place], [place]  [address] Phone: [phone]; Fax: [phone]

## 2019-10-04 ENCOUNTER — Other Ambulatory Visit (HOSPITAL_COMMUNITY): Payer: Medicare HMO

## 2019-10-04 DIAGNOSIS — J449 Chronic obstructive pulmonary disease, unspecified: Secondary | ICD-10-CM | POA: Diagnosis not present

## 2019-10-04 DIAGNOSIS — M1991 Primary osteoarthritis, unspecified site: Secondary | ICD-10-CM | POA: Diagnosis not present

## 2019-10-04 DIAGNOSIS — Z720 Tobacco use: Secondary | ICD-10-CM | POA: Diagnosis not present

## 2019-10-05 DIAGNOSIS — J449 Chronic obstructive pulmonary disease, unspecified: Secondary | ICD-10-CM | POA: Diagnosis not present

## 2019-10-05 DIAGNOSIS — Z681 Body mass index (BMI) 19 or less, adult: Secondary | ICD-10-CM | POA: Diagnosis not present

## 2019-10-05 DIAGNOSIS — M1991 Primary osteoarthritis, unspecified site: Secondary | ICD-10-CM | POA: Diagnosis not present

## 2019-10-05 DIAGNOSIS — G894 Chronic pain syndrome: Secondary | ICD-10-CM | POA: Diagnosis not present

## 2019-10-05 DIAGNOSIS — I10 Essential (primary) hypertension: Secondary | ICD-10-CM | POA: Diagnosis not present

## 2019-10-06 ENCOUNTER — Other Ambulatory Visit: Payer: Self-pay

## 2019-10-06 ENCOUNTER — Ambulatory Visit (HOSPITAL_COMMUNITY)
Admission: RE | Admit: 2019-10-06 | Discharge: 2019-10-06 | Disposition: A | Discharge status: Home / Self Care | Payer: Medicare HMO | Source: Ambulatory Visit | Attending: Physician Assistant | Admitting: Physician Assistant

## 2019-10-06 ENCOUNTER — Ambulatory Visit (HOSPITAL_BASED_OUTPATIENT_CLINIC_OR_DEPARTMENT_OTHER)
Admission: RE | Admit: 2019-10-06 | Discharge: 2019-10-06 | Disposition: A | Discharge status: Home / Self Care | Payer: Medicare HMO | Source: Ambulatory Visit | Attending: Physician Assistant | Admitting: Physician Assistant

## 2019-10-06 DIAGNOSIS — F17200 Nicotine dependence, unspecified, uncomplicated: Secondary | ICD-10-CM | POA: Diagnosis not present

## 2019-10-06 DIAGNOSIS — J449 Chronic obstructive pulmonary disease, unspecified: Secondary | ICD-10-CM | POA: Diagnosis not present

## 2019-10-06 DIAGNOSIS — R011 Cardiac murmur, unspecified: Secondary | ICD-10-CM

## 2019-10-06 DIAGNOSIS — I082 Rheumatic disorders of both aortic and tricuspid valves: Secondary | ICD-10-CM | POA: Diagnosis not present

## 2019-10-06 DIAGNOSIS — K219 Gastro-esophageal reflux disease without esophagitis: Secondary | ICD-10-CM | POA: Diagnosis not present

## 2019-10-06 DIAGNOSIS — I6523 Occlusion and stenosis of bilateral carotid arteries: Secondary | ICD-10-CM | POA: Diagnosis not present

## 2019-10-06 NOTE — Progress Notes (Signed)
*  PRELIMINARY RESULTS* Echocardiogram 2D Echocardiogram has been performed.  Jamie Lam 10/06/2019, 11:54 AM

## 2019-10-27 DIAGNOSIS — Z681 Body mass index (BMI) 19 or less, adult: Secondary | ICD-10-CM | POA: Diagnosis not present

## 2019-10-27 DIAGNOSIS — K219 Gastro-esophageal reflux disease without esophagitis: Secondary | ICD-10-CM | POA: Diagnosis not present

## 2019-10-27 DIAGNOSIS — G894 Chronic pain syndrome: Secondary | ICD-10-CM | POA: Diagnosis not present

## 2019-10-27 DIAGNOSIS — J449 Chronic obstructive pulmonary disease, unspecified: Secondary | ICD-10-CM | POA: Diagnosis not present

## 2019-10-27 DIAGNOSIS — I10 Essential (primary) hypertension: Secondary | ICD-10-CM | POA: Diagnosis not present

## 2019-11-03 DIAGNOSIS — Z720 Tobacco use: Secondary | ICD-10-CM | POA: Diagnosis not present

## 2019-11-03 DIAGNOSIS — J449 Chronic obstructive pulmonary disease, unspecified: Secondary | ICD-10-CM | POA: Diagnosis not present

## 2019-11-03 DIAGNOSIS — M1991 Primary osteoarthritis, unspecified site: Secondary | ICD-10-CM | POA: Diagnosis not present

## 2019-11-15 ENCOUNTER — Other Ambulatory Visit: Payer: Self-pay | Admitting: *Deleted

## 2019-11-15 ENCOUNTER — Encounter: Payer: Self-pay | Admitting: *Deleted

## 2019-11-15 MED ORDER — PEG 3350-KCL-NA BICARB-NACL 420 G PO SOLR
4000.0000 mL | Freq: Once | ORAL | 0 refills | Status: DC
Start: 2019-11-15 — End: 2019-11-15

## 2019-11-15 MED ORDER — PEG 3350-KCL-NA BICARB-NACL 420 G PO SOLR
4000.0000 mL | Freq: Once | ORAL | 0 refills | Status: AC
Start: 2019-11-15 — End: 2019-11-15

## 2019-11-15 NOTE — Addendum Note (Signed)
Addended by: Noreene Larsson on: 11/15/2019 11:03 AM   Modules accepted: Orders

## 2019-11-16 ENCOUNTER — Telehealth: Payer: Self-pay | Admitting: *Deleted

## 2019-11-16 ENCOUNTER — Other Ambulatory Visit: Payer: Self-pay | Admitting: *Deleted

## 2019-11-16 NOTE — Telephone Encounter (Signed)
Received auth from [facility] via fax.  Approved for 12/24/19-01/23/20.  Auth #: [id number].  Scanned into Epic.

## 2019-11-24 ENCOUNTER — Ambulatory Visit: Payer: Medicare HMO | Admitting: Student

## 2019-11-24 NOTE — Progress Notes (Deleted)
Cardiology Office Note    Date:  11/24/2019   ID:  Jamie Lam, DOB [birth year], MRN [id number]  PCP:  Jamie Nevins, MD  Cardiologist: Jamie Rich, MD    No chief complaint on file.   History of Present Illness:    Jamie Lam is a 64 y.o. male with past medical history of carotid artery stenosis, HLD, COPD and tobacco use who presents to the office today for 30-month follow-up.  He was last examined Jamie Reedy, PA-C in 09/2019 and reported having chronic dyspnea on exertion but denied any associated chest pain or palpitations.  He was smoking approximately 1 pack/day and had previously been unsuccessful with Chantix.  He did have a cardiac murmur on examination and echocardiogram was recommended for further evaluation.  He was referred to pulmonology for further management of his COPD.  His echocardiogram showed a preserved EF of 60 to 65% with no regional wall motion abnormalities. RV function was normal and he did have trivial aortic regurgitation but no evidence of stenosis. Carotid Dopplers showed moderate (50 to 69%) stenosis along the RICA and LICA with follow-up imaging recommended in 1 year.  - Repeat FLP  Past Medical History:  Diagnosis Date  . Asthma   . 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 COPD (chronic obstructive pulmonary disease) (HCC)   . GERD (gastroesophageal reflux disease)     Past Surgical History:  Procedure Laterality Date  . BACK SURGERY    . COLONOSCOPY  2009   Dr. Darrick Lam: rare sigmoid diverticula, otherwise no polyps, masses, inflammatory changes, or AVMS.   Marland Kitchen ESOPHAGOGASTRODUODENOSCOPY  2007   non-erosive antral gastirtis, positive H.pylori serology, reportedly treated with Prevpac    Current Medications: Outpatient Medications Prior to Visit  Medication Sig Dispense Refill  . albuterol (PROVENTIL) 2 MG tablet Take 2 mg by mouth 4 (four) times daily.    Marland Kitchen aspirin 81 MG chewable tablet Chew  by mouth daily.    Marland Kitchen atorvastatin (LIPITOR) 40 MG tablet Take 40 mg by mouth daily.     Marland Kitchen co-enzyme Q-10 30 MG capsule Take 30 mg by mouth daily.    . Fluticasone-Umeclidin-Vilant (TRELEGY ELLIPTA) 100-62.5-25 MCG/INH AEPB Inhale into the lungs daily.    Marland Kitchen gabapentin (NEURONTIN) 800 MG tablet Take 1 tablet by mouth as needed.     Marland Kitchen HYDROcodone-acetaminophen (NORCO) 10-325 MG per tablet Take 1 tablet by mouth every 6 (six) hours as needed. pain    . omeprazole (PRILOSEC) 40 MG capsule Take 40 mg by mouth daily.    . traMADol (ULTRAM) 50 MG tablet Take 100 mg by mouth every 6 (six) hours as needed. pain    . traZODone (DESYREL) 50 MG tablet Take 50 mg by mouth at bedtime.    Marland Kitchen zolpidem (AMBIEN) 10 MG tablet Take 10 mg by mouth at bedtime.      No facility-administered medications prior to visit.     Allergies:   Abilify [aripiprazole] and Azithromycin   Social History   Socioeconomic History  . Marital status: Married    Spouse name: Not on file  . Number of children: Not on file  . Years of education: Not on file  . Highest education level: Not on file  Occupational History  . Not on file  Tobacco Use  . Smoking status: Current Every Day Smoker    Packs/day: 1.50    Types: Cigarettes  . Smokeless tobacco: Never Used  Vaping Use  . Vaping Use: Never used  Substance and Sexual Activity  . Alcohol use: No  . Drug use: No  . Sexual activity: Yes  Other Topics Concern  . Not on file  Social History Narrative  . Not on file   Social Determinants of Health   Financial Resource Strain:   . Difficulty of Paying Living Expenses:   Food Insecurity:   . Worried About Programme researcher, broadcasting/film/video in the Last Year:   . Barista in the Last Year:   Transportation Needs:   . Freight forwarder (Medical):   Marland Kitchen Lack of Transportation (Non-Medical):   Physical Activity:   . Days of Exercise per Week:   . Minutes of Exercise per Session:   Stress:   . Feeling of Stress :   Social  Connections:   . Frequency of Communication with Friends and Family:   . Frequency of Social Gatherings with Friends and Family:   . Attends Religious Services:   . Active Member of Clubs or Organizations:   . Attends Banker Meetings:   Marland Kitchen Marital Status:      Family History:  The patient's ***family history includes Cancer in his father; Heart failure in his mother.   Review of Systems:   Please see the history of present illness.     General:  No chills, fever, night sweats or weight changes.  Cardiovascular:  No chest pain, dyspnea on exertion, edema, orthopnea, palpitations, paroxysmal nocturnal dyspnea. Dermatological: No rash, lesions/masses Respiratory: No cough, dyspnea Urologic: No hematuria, dysuria Abdominal:   No nausea, vomiting, diarrhea, bright red blood per rectum, melena, or hematemesis Neurologic:  No visual changes, wkns, changes in mental status. All other systems reviewed and are otherwise negative except as noted above.   Physical Exam:    VS:  There were no vitals taken for this visit.   General: Well developed, well nourished,male appearing in no acute distress. Head: Normocephalic, atraumatic, sclera non-icteric.  Neck: No carotid bruits. JVD not elevated.  Lungs: Respirations regular and unlabored, without wheezes or rales.  Heart: ***Regular rate and rhythm. No S3 or S4.  No murmur, no rubs, or gallops appreciated. Abdomen: Soft, non-tender, non-distended. No obvious abdominal masses. Msk:  Strength and tone appear normal for age. No obvious joint deformities or effusions. Extremities: No clubbing or cyanosis. No edema.  Distal pedal pulses are 2+ bilaterally. Neuro: Alert and oriented X 3. Moves all extremities spontaneously. No focal deficits noted. Psych:  Responds to questions appropriately with a normal affect. Skin: No rashes or lesions noted  Wt Readings from Last 3 Encounters:  09/20/19 122 lb (55.3 kg)  09/17/19 126 lb (57.2  kg)  07/17/18 130 lb (59 kg)        Studies/Labs Reviewed:   EKG:  EKG is*** ordered today.  The ekg ordered today demonstrates ***  Recent Labs: No results found for requested labs within last 8760 hours.   Lipid Panel No results found for: CHOL, TRIG, HDL, CHOLHDL, VLDL, LDLCALC, LDLDIRECT  Additional studies/ records that were reviewed today include:   Echocardiogram: 10/2019 IMPRESSIONS    1. Left ventricular ejection fraction, by estimation, is 60 to 65%. The  left ventricle has normal function. The left ventricle has no regional  wall motion abnormalities. Left ventricular diastolic parameters were  normal.  2. Right ventricular systolic function is normal. The right ventricular  size is normal. There is normal pulmonary artery systolic pressure.  3.  The mitral valve is normal in structure. No evidence of mitral valve  regurgitation. No evidence of mitral stenosis.  4. The aortic valve is tricuspid. Aortic valve regurgitation is trivial.  No aortic stenosis is present.  5. The inferior vena cava is normal in size with greater than 50%  respiratory variability, suggesting right atrial pressure of 3 mmHg.   Carotid Dopplers: 10/2019 IMPRESSION: 1. Moderate (50-69%) stenosis proximal right internal carotid artery secondary to moderate heterogeneous atherosclerotic plaque. 2. Moderate (50-69%) stenosis proximal left internal carotid artery secondary to moderate heterogeneous atherosclerotic plaque. 3. Subjectively, the stenoses are likely at the lower end of the range and may be slightly overestimated secondary to relatively high peak systolic velocities in the common carotid arteries bilaterally. 4. The vertebral arteries are patent with normal antegrade flow. No indication of flow reversal on the right.  Assessment:    No diagnosis found.   Plan:   In order of problems listed above:  1. ***    Medication Adjustments/Labs and Tests  Ordered: Current medicines are reviewed at length with the patient today.  Concerns regarding medicines are outlined above.  Medication changes, Labs and Tests ordered today are listed in the Patient Instructions below. There are no Patient Instructions on file for this visit.   Signed, Ellsworth Lennox, PA-C  11/24/2019 11:53 AM    [facility] [address] [place], [place] [address] Phone: [phone] Fax: [phone]

## 2019-12-03 DIAGNOSIS — E7849 Other hyperlipidemia: Secondary | ICD-10-CM | POA: Diagnosis not present

## 2019-12-03 DIAGNOSIS — I10 Essential (primary) hypertension: Secondary | ICD-10-CM | POA: Diagnosis not present

## 2019-12-09 ENCOUNTER — Encounter (HOSPITAL_COMMUNITY)
Admission: RE | Admit: 2019-12-09 | Discharge: 2019-12-09 | Disposition: A | Discharge status: Home / Self Care | Payer: Medicare HMO | Source: Ambulatory Visit | Attending: Internal Medicine | Admitting: Internal Medicine

## 2019-12-09 ENCOUNTER — Other Ambulatory Visit: Payer: Self-pay

## 2019-12-16 ENCOUNTER — Telehealth: Payer: Self-pay

## 2019-12-16 NOTE — Telephone Encounter (Signed)
Pt wants to r/s his TCS on 12/24/19 with Dr. Marletta Lor.

## 2019-12-16 NOTE — Telephone Encounter (Signed)
Called pt to reschedule. He wanted to cancel for now d/t rise in covid cases. He did not feel comfortable having procedure done now. He will call back when he is ready. FYI to Lewie Loron, NP

## 2019-12-16 NOTE — Telephone Encounter (Signed)
Called pt, received fast busy signal x 3

## 2019-12-22 ENCOUNTER — Other Ambulatory Visit (HOSPITAL_COMMUNITY)
Admission: RE | Admit: 2019-12-22 | Discharge: 2019-12-22 | Disposition: A | Discharge status: Home / Self Care | Payer: Medicare HMO | Source: Ambulatory Visit | Attending: Internal Medicine | Admitting: Internal Medicine

## 2019-12-24 ENCOUNTER — Ambulatory Visit (HOSPITAL_COMMUNITY): Admit: 2019-12-24 | Payer: Medicare HMO

## 2019-12-24 ENCOUNTER — Encounter (HOSPITAL_COMMUNITY): Payer: Self-pay

## 2019-12-24 SURGERY — COLONOSCOPY WITH PROPOFOL
Anesthesia: Monitor Anesthesia Care

## 2020-01-04 DIAGNOSIS — Z720 Tobacco use: Secondary | ICD-10-CM | POA: Diagnosis not present

## 2020-01-04 DIAGNOSIS — J449 Chronic obstructive pulmonary disease, unspecified: Secondary | ICD-10-CM | POA: Diagnosis not present

## 2020-01-04 DIAGNOSIS — M1991 Primary osteoarthritis, unspecified site: Secondary | ICD-10-CM | POA: Diagnosis not present

## 2020-01-12 DIAGNOSIS — G894 Chronic pain syndrome: Secondary | ICD-10-CM | POA: Diagnosis not present

## 2020-02-03 DIAGNOSIS — M1991 Primary osteoarthritis, unspecified site: Secondary | ICD-10-CM | POA: Diagnosis not present

## 2020-02-03 DIAGNOSIS — J449 Chronic obstructive pulmonary disease, unspecified: Secondary | ICD-10-CM | POA: Diagnosis not present

## 2020-02-03 DIAGNOSIS — Z720 Tobacco use: Secondary | ICD-10-CM | POA: Diagnosis not present

## 2020-02-15 DIAGNOSIS — M1991 Primary osteoarthritis, unspecified site: Secondary | ICD-10-CM | POA: Diagnosis not present

## 2020-02-15 DIAGNOSIS — G894 Chronic pain syndrome: Secondary | ICD-10-CM | POA: Diagnosis not present

## 2020-02-15 DIAGNOSIS — I10 Essential (primary) hypertension: Secondary | ICD-10-CM | POA: Diagnosis not present

## 2020-02-15 DIAGNOSIS — J449 Chronic obstructive pulmonary disease, unspecified: Secondary | ICD-10-CM | POA: Diagnosis not present

## 2020-03-04 DIAGNOSIS — J449 Chronic obstructive pulmonary disease, unspecified: Secondary | ICD-10-CM | POA: Diagnosis not present

## 2020-03-04 DIAGNOSIS — M1991 Primary osteoarthritis, unspecified site: Secondary | ICD-10-CM | POA: Diagnosis not present

## 2020-03-04 DIAGNOSIS — Z720 Tobacco use: Secondary | ICD-10-CM | POA: Diagnosis not present

## 2020-03-06 DIAGNOSIS — Z23 Encounter for immunization: Secondary | ICD-10-CM | POA: Diagnosis not present

## 2020-03-06 DIAGNOSIS — Z681 Body mass index (BMI) 19 or less, adult: Secondary | ICD-10-CM | POA: Diagnosis not present

## 2020-03-06 DIAGNOSIS — M5136 Other intervertebral disc degeneration, lumbar region: Secondary | ICD-10-CM | POA: Diagnosis not present

## 2020-03-06 DIAGNOSIS — G894 Chronic pain syndrome: Secondary | ICD-10-CM | POA: Diagnosis not present

## 2020-04-14 DIAGNOSIS — M1991 Primary osteoarthritis, unspecified site: Secondary | ICD-10-CM | POA: Diagnosis not present

## 2020-04-14 DIAGNOSIS — G894 Chronic pain syndrome: Secondary | ICD-10-CM | POA: Diagnosis not present

## 2020-05-05 DIAGNOSIS — M1991 Primary osteoarthritis, unspecified site: Secondary | ICD-10-CM | POA: Diagnosis not present

## 2020-05-05 DIAGNOSIS — J449 Chronic obstructive pulmonary disease, unspecified: Secondary | ICD-10-CM | POA: Diagnosis not present

## 2020-05-05 DIAGNOSIS — Z720 Tobacco use: Secondary | ICD-10-CM | POA: Diagnosis not present

## 2020-05-10 DIAGNOSIS — J449 Chronic obstructive pulmonary disease, unspecified: Secondary | ICD-10-CM | POA: Diagnosis not present

## 2020-05-10 DIAGNOSIS — G894 Chronic pain syndrome: Secondary | ICD-10-CM | POA: Diagnosis not present

## 2020-05-10 DIAGNOSIS — Z681 Body mass index (BMI) 19 or less, adult: Secondary | ICD-10-CM | POA: Diagnosis not present

## 2020-05-10 DIAGNOSIS — M5136 Other intervertebral disc degeneration, lumbar region: Secondary | ICD-10-CM | POA: Diagnosis not present

## 2020-05-10 DIAGNOSIS — Z1331 Encounter for screening for depression: Secondary | ICD-10-CM | POA: Diagnosis not present

## 2020-05-10 DIAGNOSIS — Z0000 Encounter for general adult medical examination without abnormal findings: Secondary | ICD-10-CM | POA: Diagnosis not present

## 2020-06-02 DIAGNOSIS — Z681 Body mass index (BMI) 19 or less, adult: Secondary | ICD-10-CM | POA: Diagnosis not present

## 2020-06-02 DIAGNOSIS — S8391XA Sprain of unspecified site of right knee, initial encounter: Secondary | ICD-10-CM | POA: Diagnosis not present

## 2020-06-03 DIAGNOSIS — Z720 Tobacco use: Secondary | ICD-10-CM | POA: Diagnosis not present

## 2020-06-03 DIAGNOSIS — M1991 Primary osteoarthritis, unspecified site: Secondary | ICD-10-CM | POA: Diagnosis not present

## 2020-06-03 DIAGNOSIS — J449 Chronic obstructive pulmonary disease, unspecified: Secondary | ICD-10-CM | POA: Diagnosis not present

## 2020-06-06 DIAGNOSIS — J449 Chronic obstructive pulmonary disease, unspecified: Secondary | ICD-10-CM | POA: Diagnosis not present

## 2020-06-06 DIAGNOSIS — G894 Chronic pain syndrome: Secondary | ICD-10-CM | POA: Diagnosis not present

## 2020-06-07 ENCOUNTER — Other Ambulatory Visit (HOSPITAL_COMMUNITY): Payer: Self-pay | Admitting: Family Medicine

## 2020-06-07 ENCOUNTER — Other Ambulatory Visit (HOSPITAL_COMMUNITY): Payer: Self-pay | Admitting: Internal Medicine

## 2020-06-07 ENCOUNTER — Ambulatory Visit (HOSPITAL_COMMUNITY)
Admission: RE | Admit: 2020-06-07 | Discharge: 2020-06-07 | Disposition: A | Discharge status: Home / Self Care | Payer: Medicare HMO | Source: Ambulatory Visit | Attending: Family Medicine | Admitting: Family Medicine

## 2020-06-07 ENCOUNTER — Ambulatory Visit (HOSPITAL_COMMUNITY)
Admission: RE | Admit: 2020-06-07 | Discharge: 2020-06-07 | Disposition: A | Discharge status: Home / Self Care | Payer: Medicare HMO | Source: Ambulatory Visit | Attending: Internal Medicine | Admitting: Internal Medicine

## 2020-06-07 ENCOUNTER — Other Ambulatory Visit: Payer: Self-pay | Admitting: Internal Medicine

## 2020-06-07 ENCOUNTER — Other Ambulatory Visit: Payer: Self-pay

## 2020-06-07 DIAGNOSIS — S8391XA Sprain of unspecified site of right knee, initial encounter: Secondary | ICD-10-CM

## 2020-06-07 DIAGNOSIS — M79604 Pain in right leg: Secondary | ICD-10-CM | POA: Insufficient documentation

## 2020-06-07 DIAGNOSIS — S8001XA Contusion of right knee, initial encounter: Secondary | ICD-10-CM | POA: Diagnosis not present

## 2020-06-07 DIAGNOSIS — M79661 Pain in right lower leg: Secondary | ICD-10-CM | POA: Diagnosis not present

## 2020-06-07 DIAGNOSIS — M7989 Other specified soft tissue disorders: Secondary | ICD-10-CM | POA: Diagnosis not present

## 2020-06-07 DIAGNOSIS — M25571 Pain in right ankle and joints of right foot: Secondary | ICD-10-CM

## 2020-06-09 ENCOUNTER — Ambulatory Visit
Admission: EM | Admit: 2020-06-09 | Discharge: 2020-06-09 | Disposition: A | Discharge status: Home / Self Care | Payer: Medicare HMO | Attending: Family Medicine | Admitting: Family Medicine

## 2020-06-09 ENCOUNTER — Encounter: Payer: Self-pay | Admitting: Emergency Medicine

## 2020-06-09 ENCOUNTER — Other Ambulatory Visit: Payer: Self-pay

## 2020-06-09 ENCOUNTER — Ambulatory Visit (INDEPENDENT_AMBULATORY_CARE_PROVIDER_SITE_OTHER): Payer: Medicare HMO

## 2020-06-09 DIAGNOSIS — R600 Localized edema: Secondary | ICD-10-CM | POA: Diagnosis not present

## 2020-06-09 DIAGNOSIS — L03115 Cellulitis of right lower limb: Secondary | ICD-10-CM

## 2020-06-09 DIAGNOSIS — M7989 Other specified soft tissue disorders: Secondary | ICD-10-CM | POA: Diagnosis not present

## 2020-06-09 DIAGNOSIS — M79604 Pain in right leg: Secondary | ICD-10-CM | POA: Diagnosis not present

## 2020-06-09 DIAGNOSIS — M79661 Pain in right lower leg: Secondary | ICD-10-CM

## 2020-06-09 DIAGNOSIS — R2243 Localized swelling, mass and lump, lower limb, bilateral: Secondary | ICD-10-CM | POA: Diagnosis not present

## 2020-06-09 MED ORDER — DOXYCYCLINE HYCLATE 100 MG PO CAPS: 100.0000 mg | ORAL_CAPSULE | Freq: Two times a day (BID) | ORAL | 0 refills | Status: DC

## 2020-06-09 NOTE — ED Triage Notes (Signed)
Pain to RT leg from knee down to toes.  Pt had xray of knee and ultrasound to r/o dvt, both were negative.  Pt had tractor tire fall onto his leg x 1 week ago.

## 2020-06-09 NOTE — Discharge Instructions (Addendum)
I have sent in doxycycline for you to take one tablet twice a day for 7 days  Your xrays are negative for fractures and dislocations today.  I am going to cover you for cellulitis with the antibiotic today.  Follow up with this office or with primary care if symptoms are persisting.  Follow up in the ER for high fever, trouble swallowing, trouble breathing, other concerning symptoms.

## 2020-06-09 NOTE — ED Provider Notes (Signed)
[facility]   [id number] 06/09/20 Arrival Time: 1844  YI:AXKPV PAIN  SUBJECTIVE: History from: patient. Jamie Lam is a 65 y.o. male complains of right lower leg pain that began 10 days ago when a tractor tire fell on his leg. Localizes the pain to the right lower leg from the knee down. Describes the pain as intermittent and achy and tender in character. Has tried OTC medications without relief. Symptoms are made worse with activity.  Denies similar symptoms in the past.  Denies fever, chills, numbness and tingling, saddle paresthesias, loss of bowel or bladder function.      ROS: As per HPI.  All other pertinent ROS negative.     Past Medical History:  Diagnosis Date  . Asthma   . 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 COPD (chronic obstructive pulmonary disease) (HCC)   . GERD (gastroesophageal reflux disease)    Past Surgical History:  Procedure Laterality Date  . BACK SURGERY    . COLONOSCOPY  2009   Dr. Darrick Penna: rare sigmoid diverticula, otherwise no polyps, masses, inflammatory changes, or AVMS.   Marland Kitchen ESOPHAGOGASTRODUODENOSCOPY  2007   non-erosive antral gastirtis, positive H.pylori serology, reportedly treated with Prevpac   Allergies  Allergen Reactions  . Abilify [Aripiprazole] Other (See Comments)    "makes me feel crazy"  . Azithromycin Hives   No current facility-administered medications on file prior to encounter.   Current Outpatient Medications on File Prior to Encounter  Medication Sig Dispense Refill  . albuterol (PROVENTIL) 2 MG tablet Take 2 mg by mouth 4 (four) times daily.    Marland Kitchen albuterol (VENTOLIN HFA) 108 (90 Base) MCG/ACT inhaler Inhale 1-2 puffs into the lungs every 6 (six) hours as needed for wheezing or shortness of breath.     Marland Kitchen aspirin EC 81 MG tablet Take 81 mg by mouth at bedtime. Swallow whole.    Marland Kitchen atorvastatin (LIPITOR) 40 MG tablet Take 40 mg by mouth every Monday, Wednesday, and Friday.     . B  Complex-C (B-COMPLEX WITH VITAMIN C) tablet Take 3 tablets by mouth daily.     . Cholecalciferol (VITAMIN D3 PO) Take 2 tablets by mouth daily.    Marland Kitchen gabapentin (NEURONTIN) 800 MG tablet Take 800 mg by mouth in the morning, at noon, in the evening, and at bedtime.     Marland Kitchen HYDROcodone-acetaminophen (NORCO) 10-325 MG per tablet Take 1 tablet by mouth every 4 (four) hours as needed (pain.).     Marland Kitchen omeprazole (PRILOSEC) 40 MG capsule Take 40 mg by mouth daily before breakfast.     . tiZANidine (ZANAFLEX) 4 MG tablet Take 4 mg by mouth in the morning, at noon, in the evening, and at bedtime.     . traMADol (ULTRAM) 50 MG tablet Take 100 mg by mouth every 4 (four) hours as needed (pain.).     Marland Kitchen traZODone (DESYREL) 100 MG tablet Take 100 mg by mouth at bedtime.     . TRELEGY ELLIPTA 200-62.5-25 MCG/INH AEPB Inhale 1 puff into the lungs daily as needed (high pollen/high temperature (poor air quality)).     Marland Kitchen zolpidem (AMBIEN) 10 MG tablet Take 10 mg by mouth at bedtime.      Social History   Socioeconomic History  . Marital status: Married    Spouse name: Not on file  . Number of children: Not on file  . Years of education: Not on file  . Highest  education level: Not on file  Occupational History  . Not on file  Tobacco Use  . Smoking status: Current Every Day Smoker    Packs/day: 1.50    Types: Cigarettes  . Smokeless tobacco: Never Used  Vaping Use  . Vaping Use: Never used  Substance and Sexual Activity  . Alcohol use: No  . Drug use: No  . Sexual activity: Yes  Other Topics Concern  . Not on file  Social History Narrative  . Not on file   Social Determinants of Health   Financial Resource Strain: Not on file  Food Insecurity: Not on file  Transportation Needs: Not on file  Physical Activity: Not on file  Stress: Not on file  Social Connections: Not on file  Intimate Partner Violence: Not on file   Family History  Problem Relation Age of Onset  . Heart failure Mother   .  Cancer Father   . Colon cancer Neg Hx   . Colon polyps Neg Hx     OBJECTIVE:  Vitals:   06/09/20 1905  BP: 123/68  Pulse: 82  Resp: 19  Temp: 98.6 F (37 C)  TempSrc: Oral  SpO2: 93%    General appearance: ALERT; in no acute distress.  Head: NCAT Lungs: Normal respiratory effort CV: pulses 2+ bilaterally. Cap refill < 2 seconds Musculoskeletal:  Inspection: Skin warm, dry, clear and intact Erythema, effusion to RLE Palpation: RLE tender to palpation ROM: Limited ROM active and passive to RLE Skin: warm and dry, tight, tender to touch to RLE Neurologic: Ambulates without difficulty; Sensation intact about the upper/ lower extremities Psychological: alert and cooperative; normal mood and affect  DIAGNOSTIC STUDIES:  DG Tibia/Fibula Right  Result Date: 06/09/2020 CLINICAL DATA:  65 year old male with right lower extremity pain and swelling. EXAM: RIGHT TIBIA AND FIBULA - 2 VIEW; RIGHT FOOT COMPLETE - 3+ VIEW COMPARISON:  None. FINDINGS: There is no acute fracture or dislocation. The bones are mildly osteopenic. No significant arthritic changes. There is diffuse subcutaneous edema and soft tissue swelling of the foot, likely cellulitis. Clinical correlation is recommended. No radiopaque foreign object or soft tissue gas. IMPRESSION: 1. No acute fracture or dislocation. 2. Findings likely represent cellulitis. Clinical correlation is recommended. Electronically Signed   By: Elgie Collard M.D.   On: 06/09/2020 19:33   DG Foot Complete Right  Result Date: 06/09/2020 CLINICAL DATA:  65 year old male with right lower extremity pain and swelling. EXAM: RIGHT TIBIA AND FIBULA - 2 VIEW; RIGHT FOOT COMPLETE - 3+ VIEW COMPARISON:  None. FINDINGS: There is no acute fracture or dislocation. The bones are mildly osteopenic. No significant arthritic changes. There is diffuse subcutaneous edema and soft tissue swelling of the foot, likely cellulitis. Clinical correlation is recommended. No  radiopaque foreign object or soft tissue gas. IMPRESSION: 1. No acute fracture or dislocation. 2. Findings likely represent cellulitis. Clinical correlation is recommended. Electronically Signed   By: Elgie Collard M.D.   On: 06/09/2020 19:33     ASSESSMENT & PLAN:  1. Cellulitis of right lower extremity   2. Right leg pain   3. Right leg swelling      Meds ordered this encounter  Medications  . doxycycline (VIBRAMYCIN) 100 MG capsule    Sig: Take 1 capsule (100 mg total) by mouth 2 (two) times daily.    Dispense:  14 capsule    Refill:  0    Order Specific Question:   Supervising Provider    Answer:  LAMPTEY, Britta Mccreedy [[id number]]    Xrays are negative for fractures, misalignments, osteomyelitis Will cover for cellulitis  Prescribed doxycycline 100mg  BID x 7 days Continue conservative management of rest, ice, and gentle stretches Take ibuprofen as needed for pain relief (may cause abdominal discomfort, ulcers, and GI bleeds avoid taking with other NSAIDs) If redness worsens or spreads further up the leg, you have a fever, trouble swallowing, trouble breathing, follow up in the ER  Reviewed expectations re: course of current medical issues. Questions answered. Outlined signs and symptoms indicating need for more acute intervention. Patient verbalized understanding. After Visit Summary given.       , NP 06/10/20 [phone]

## 2020-07-03 DIAGNOSIS — M1991 Primary osteoarthritis, unspecified site: Secondary | ICD-10-CM | POA: Diagnosis not present

## 2020-07-03 DIAGNOSIS — J449 Chronic obstructive pulmonary disease, unspecified: Secondary | ICD-10-CM | POA: Diagnosis not present

## 2020-07-07 DIAGNOSIS — G894 Chronic pain syndrome: Secondary | ICD-10-CM | POA: Diagnosis not present

## 2020-07-07 DIAGNOSIS — M1991 Primary osteoarthritis, unspecified site: Secondary | ICD-10-CM | POA: Diagnosis not present

## 2020-07-07 DIAGNOSIS — Z681 Body mass index (BMI) 19 or less, adult: Secondary | ICD-10-CM | POA: Diagnosis not present

## 2020-07-07 DIAGNOSIS — J449 Chronic obstructive pulmonary disease, unspecified: Secondary | ICD-10-CM | POA: Diagnosis not present

## 2020-07-07 DIAGNOSIS — I10 Essential (primary) hypertension: Secondary | ICD-10-CM | POA: Diagnosis not present

## 2020-07-27 ENCOUNTER — Other Ambulatory Visit: Payer: Self-pay

## 2020-07-27 ENCOUNTER — Ambulatory Visit: Payer: Medicare HMO | Admitting: Orthopaedic Surgery

## 2020-07-27 ENCOUNTER — Encounter: Payer: Self-pay | Admitting: Orthopaedic Surgery

## 2020-07-27 VITALS — BP 127/73 | HR 85 | Ht 68.0 in | Wt 126.0 lb

## 2020-07-27 DIAGNOSIS — M25561 Pain in right knee: Secondary | ICD-10-CM

## 2020-07-27 DIAGNOSIS — G8929 Other chronic pain: Secondary | ICD-10-CM

## 2020-07-27 DIAGNOSIS — F17210 Nicotine dependence, cigarettes, uncomplicated: Secondary | ICD-10-CM

## 2020-07-27 NOTE — Patient Instructions (Signed)
While we are working on your approval please go ahead and call to schedule your appointment with [facility] in at least one (1) week.  ° °Central Scheduling °[phone] °

## 2020-07-27 NOTE — Progress Notes (Signed)
Subjective:    Patient ID: Jamie Lam, male    DOB: 10-30-1955, 65 y.o.   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  He was pinned against the wall for a while until others could get the tire off him.  It happened the end of January, 2022.  He saw Dr. Sherwood Gambler and had doppler at hospital on 06-07-20 which was negative.  He continued to have pain in the knee and went to Urgent Care on 06-09-20.  X-rays were done of the knee which did not show a fracture, some mild DJD.  He had ankle pain as well but had full motion.  He has been given Advil, Tylenol, Toradol, hydrocodone, ice, rest, heat with little help.  His right knee swells and gives way.  He is not getting any better.  He is tired of hurting and limping.  I have reviewed the X-rays, doppler, and Urgent Care notes.  I have independently reviewed and interpreted x-rays of this patient done at another site by another physician or qualified health professional.     Review of Systems  Constitutional: Positive for activity change.  Respiratory: Positive for shortness of breath.   Musculoskeletal: Positive for arthralgias, back pain, gait problem and joint swelling.  All other systems reviewed and are negative.  For Review of Systems, all other systems reviewed and are negative.  The following is a summary of the past history medically, past history surgically, known current medicines, social history and family history.  This information is gathered electronically by the computer from prior information and documentation.  I review this each visit and have found including this information at this point in the chart is beneficial and informative.   Past Medical History:  Diagnosis Date  . Asthma   . 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 COPD (chronic obstructive pulmonary disease) (HCC)   . GERD (gastroesophageal reflux  disease)     Past Surgical History:  Procedure Laterality Date  . BACK SURGERY    . COLONOSCOPY  2009   Dr. Darrick Penna: rare sigmoid diverticula, otherwise no polyps, masses, inflammatory changes, or AVMS.   Marland Kitchen ESOPHAGOGASTRODUODENOSCOPY  2007   non-erosive antral gastirtis, positive H.pylori serology, reportedly treated with Prevpac    Current Outpatient Medications on File Prior to Visit  Medication Sig Dispense Refill  . albuterol (PROVENTIL) 2 MG tablet Take 2 mg by mouth 4 (four) times daily.    Marland Kitchen albuterol (VENTOLIN HFA) 108 (90 Base) MCG/ACT inhaler Inhale 1-2 puffs into the lungs every 6 (six) hours as needed for wheezing or shortness of breath.     Marland Kitchen aspirin EC 81 MG tablet Take 81 mg by mouth at bedtime. Swallow whole.    Marland Kitchen atorvastatin (LIPITOR) 40 MG tablet Take 40 mg by mouth every Monday, Wednesday, and Friday.     . B Complex-C (B-COMPLEX WITH VITAMIN C) tablet Take 3 tablets by mouth daily.     . Cholecalciferol (VITAMIN D3 PO) Take 2 tablets by mouth daily.    Marland Kitchen doxycycline (VIBRAMYCIN) 100 MG capsule Take 1 capsule (100 mg total) by mouth 2 (two) times daily. 14 capsule 0  . gabapentin (NEURONTIN) 800 MG tablet Take 800 mg by mouth in the morning, at noon, in the evening, and at bedtime.     Marland Kitchen HYDROcodone-acetaminophen (NORCO) 10-325 MG  per tablet Take 1 tablet by mouth every 4 (four) hours as needed (pain.).     Marland Kitchen omeprazole (PRILOSEC) 40 MG capsule Take 40 mg by mouth daily before breakfast.     . tiZANidine (ZANAFLEX) 4 MG tablet Take 4 mg by mouth in the morning, at noon, in the evening, and at bedtime.     . traMADol (ULTRAM) 50 MG tablet Take 100 mg by mouth every 4 (four) hours as needed (pain.).     Marland Kitchen traZODone (DESYREL) 100 MG tablet Take 100 mg by mouth at bedtime.     . TRELEGY ELLIPTA 200-62.5-25 MCG/INH AEPB Inhale 1 puff into the lungs daily as needed (high pollen/high temperature (poor air quality)).     Marland Kitchen zolpidem (AMBIEN) 10 MG tablet Take 10 mg by mouth at  bedtime.      No current facility-administered medications on file prior to visit.    Social History   Socioeconomic History  . Marital status: Married    Spouse name: Not on file  . Number of children: Not on file  . Years of education: Not on file  . Highest education level: Not on file  Occupational History  . Not on file  Tobacco Use  . Smoking status: Current Every Day Smoker    Packs/day: 1.50    Types: Cigarettes  . Smokeless tobacco: Never Used  Vaping Use  . Vaping Use: Never used  Substance and Sexual Activity  . Alcohol use: No  . Drug use: No  . Sexual activity: Yes  Other Topics Concern  . Not on file  Social History Narrative  . Not on file   Social Determinants of Health   Financial Resource Strain: Not on file  Food Insecurity: Not on file  Transportation Needs: Not on file  Physical Activity: Not on file  Stress: Not on file  Social Connections: Not on file  Intimate Partner Violence: Not on file    Family History  Problem Relation Age of Onset  . Heart failure Mother   . Cancer Father   . Colon cancer Neg Hx   . Colon polyps Neg Hx     BP 127/73   Pulse 85   Ht 5\' 8"  (1.727 m)   Wt 126 lb (57.2 kg)   BMI 19.16 kg/m   Body mass index is 19.16 kg/m.      Objective:   Physical Exam Vitals and nursing note reviewed. Exam conducted with a chaperone present.  Constitutional:      Appearance: He is well-developed.  HENT:     Head: Normocephalic and atraumatic.  Eyes:     Conjunctiva/sclera: Conjunctivae normal.     Pupils: Pupils are equal, round, and reactive to light.  Cardiovascular:     Rate and Rhythm: Normal rate and regular rhythm.  Pulmonary:     Effort: Pulmonary effort is normal.  Abdominal:     Palpations: Abdomen is soft.  Musculoskeletal:     Cervical back: Normal range of motion and neck supple.       Legs:  Skin:    General: Skin is warm and dry.  Neurological:     Mental Status: He is alert and oriented to  person, place, and time.     Cranial Nerves: No cranial nerve deficit.     Motor: No abnormal muscle tone.     Coordination: Coordination normal.     Deep Tendon Reflexes: Reflexes are normal and symmetric. Reflexes normal.  Psychiatric:  Behavior: Behavior normal.        Thought Content: Thought content normal.        Judgment: Judgment normal.           Assessment & Plan:   Encounter Diagnoses  Name Primary?  . Chronic pain of right knee Yes  . Nicotine dependence, cigarettes, uncomplicated    I am concerned about meniscus tear.  He has not gotten better in almost two months.  I will order MRI.  Return in two weeks.  Call if any problem.  Precautions discussed.   Electronically Signed Darreld Mclean, MD 3/24/20228:21 AM

## 2020-08-02 DIAGNOSIS — Z720 Tobacco use: Secondary | ICD-10-CM | POA: Diagnosis not present

## 2020-08-02 DIAGNOSIS — J449 Chronic obstructive pulmonary disease, unspecified: Secondary | ICD-10-CM | POA: Diagnosis not present

## 2020-08-02 DIAGNOSIS — M1991 Primary osteoarthritis, unspecified site: Secondary | ICD-10-CM | POA: Diagnosis not present

## 2020-08-04 ENCOUNTER — Ambulatory Visit (HOSPITAL_COMMUNITY)
Admission: RE | Admit: 2020-08-04 | Discharge: 2020-08-04 | Disposition: A | Discharge status: Home / Self Care | Payer: Medicare HMO | Source: Ambulatory Visit | Attending: Orthopaedic Surgery | Admitting: Orthopaedic Surgery

## 2020-08-04 DIAGNOSIS — G8929 Other chronic pain: Secondary | ICD-10-CM | POA: Diagnosis not present

## 2020-08-04 DIAGNOSIS — M25561 Pain in right knee: Secondary | ICD-10-CM | POA: Insufficient documentation

## 2020-08-07 DIAGNOSIS — I10 Essential (primary) hypertension: Secondary | ICD-10-CM | POA: Diagnosis not present

## 2020-08-07 DIAGNOSIS — G894 Chronic pain syndrome: Secondary | ICD-10-CM | POA: Diagnosis not present

## 2020-08-07 DIAGNOSIS — M1991 Primary osteoarthritis, unspecified site: Secondary | ICD-10-CM | POA: Diagnosis not present

## 2020-08-10 ENCOUNTER — Telehealth: Payer: Self-pay | Admitting: Orthopaedic Surgery

## 2020-08-10 NOTE — Telephone Encounter (Signed)
Routing to Dr Hilda Lias - please advise if patient may schedule with Dr Romeo Apple for review of results.

## 2020-08-10 NOTE — Telephone Encounter (Signed)
Called patient regarding follow up appointment for review of MRI, ordered per Dr Hilda Lias. Patient requests to follow up for results in office with Dr Romeo Apple. I relayed protocol in which patients are scheduled with the ordering provider, and if further care by another provider is indicated, we then schedule accordingly. Patient is still requesting to see Dr Romeo Apple. Please advise.

## 2020-08-10 NOTE — Telephone Encounter (Signed)
This may be a patient Dr Hilda Lias would refer to Dr Romeo Apple anyway  We need to ask Dr Hilda Lias if okay and then ask Dr. Romeo Apple if okay.  It is certainly okay with me for patient to be seen.

## 2020-08-14 NOTE — Telephone Encounter (Signed)
He has abnormal MRI of the knee.  He may have read it on MyChart.  I had told him if it was abnormal, he would need to see Dr. Rexene Edison or Dr. Salena Saner.  I have no problem with him seeing Dr. Rexene Edison for the results.  It may save a visit.  Check with Dr. Rexene Edison.

## 2020-08-15 NOTE — Telephone Encounter (Signed)
Left message offering appointment (for 08/23/20)

## 2020-08-15 NOTE — Telephone Encounter (Signed)
It has been okayed by Dr Romeo Apple as well. Calling patient to offer appointment.

## 2020-08-30 ENCOUNTER — Encounter: Payer: Self-pay | Admitting: Orthopedic Surgery

## 2020-08-30 ENCOUNTER — Other Ambulatory Visit: Payer: Self-pay

## 2020-08-30 ENCOUNTER — Ambulatory Visit: Payer: Medicare HMO | Admitting: Orthopedic Surgery

## 2020-08-30 VITALS — BP 130/68 | HR 90 | Ht 68.0 in | Wt 122.0 lb

## 2020-08-30 DIAGNOSIS — S83421A Sprain of lateral collateral ligament of right knee, initial encounter: Secondary | ICD-10-CM | POA: Diagnosis not present

## 2020-08-30 DIAGNOSIS — S8991XA Unspecified injury of right lower leg, initial encounter: Secondary | ICD-10-CM

## 2020-08-30 DIAGNOSIS — S83511A Sprain of anterior cruciate ligament of right knee, initial encounter: Secondary | ICD-10-CM

## 2020-08-30 NOTE — Progress Notes (Signed)
Chief Complaint  Patient presents with  . Knee Pain    Right   . Results    Review MRI     65 years old male injured his right knee when a tire fell on it back in January 2022.  Dr. Hilda Lias saw him and sent him for MRI has multiple ligament injury complains of limp and pain primarily lateral aspect of his right knee    Past Medical History:  Diagnosis Date  . Asthma   . 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 COPD (chronic obstructive pulmonary disease) (HCC)   . GERD (gastroesophageal reflux disease)     Physical Exam Constitutional:      General: He is not in acute distress.    Appearance: He is well-developed.     Comments: Well developed, well nourished Normal grooming and hygiene     Cardiovascular:     Comments: No peripheral edema Musculoskeletal:     Comments: He has a mild limp favoring his right knee  He can bend his knee 115 degrees he comes to full extension actively.  No increase in motion with passive range of motion he has pain on the lateral joint line and lateral collateral ligament  He is stable in extension and 30 degrees of flexion he has a 2+ PCL with a firm endpoint with his knee reduced he has a negative Lachman  Skin:    General: Skin is warm and dry.  Neurological:     Mental Status: He is alert and oriented to person, place, and time.     Sensory: No sensory deficit.     Coordination: Coordination normal.     Gait: Gait abnormal.     Deep Tendon Reflexes: Reflexes are normal and symmetric.  Psychiatric:        Mood and Affect: Mood normal.        Behavior: Behavior normal.        Thought Content: Thought content normal.        Judgment: Judgment normal.     Comments: Affect normal     Outside images AP lateral obliques both internal and external right knee no arthritis no fracture from January 2022  MRI recently shows partial ACL tear proximal PCL tear proximal and distal collateral ligament  tears  Clinically it seems like his ACL and lateral collateral ligament have scarred in his PCL he still lacks  Recommend playmaker brace and 6 weeks of physical therapy  Looking at his medical record and his age it would seem that nonoperative treatment would be best    Will come back after 6 weeks of therapy   Encounter Diagnoses  Name Primary?  Marland Kitchen PCL injury, right, initial encounter Yes  . Rupture of anterior cruciate ligament of right knee, initial encounter   . Sprain of lateral collateral ligament of right knee, initial encounter

## 2020-08-30 NOTE — Addendum Note (Signed)
Addended byCaffie Damme on: 08/30/2020 09:17 AM   Modules accepted: Orders

## 2020-09-02 DIAGNOSIS — M1991 Primary osteoarthritis, unspecified site: Secondary | ICD-10-CM | POA: Diagnosis not present

## 2020-09-02 DIAGNOSIS — J449 Chronic obstructive pulmonary disease, unspecified: Secondary | ICD-10-CM | POA: Diagnosis not present

## 2020-09-04 DIAGNOSIS — G894 Chronic pain syndrome: Secondary | ICD-10-CM | POA: Diagnosis not present

## 2020-10-03 DIAGNOSIS — Z720 Tobacco use: Secondary | ICD-10-CM | POA: Diagnosis not present

## 2020-10-03 DIAGNOSIS — M1991 Primary osteoarthritis, unspecified site: Secondary | ICD-10-CM | POA: Diagnosis not present

## 2020-10-03 DIAGNOSIS — J449 Chronic obstructive pulmonary disease, unspecified: Secondary | ICD-10-CM | POA: Diagnosis not present

## 2020-10-11 DIAGNOSIS — G894 Chronic pain syndrome: Secondary | ICD-10-CM | POA: Diagnosis not present

## 2020-10-11 DIAGNOSIS — Z681 Body mass index (BMI) 19 or less, adult: Secondary | ICD-10-CM | POA: Diagnosis not present

## 2020-10-11 DIAGNOSIS — J449 Chronic obstructive pulmonary disease, unspecified: Secondary | ICD-10-CM | POA: Diagnosis not present

## 2020-10-11 DIAGNOSIS — M1991 Primary osteoarthritis, unspecified site: Secondary | ICD-10-CM | POA: Diagnosis not present

## 2020-10-19 ENCOUNTER — Ambulatory Visit: Payer: Medicare HMO | Admitting: Orthopedic Surgery

## 2020-10-19 DIAGNOSIS — S8991XA Unspecified injury of right lower leg, initial encounter: Secondary | ICD-10-CM

## 2020-10-19 DIAGNOSIS — S83421A Sprain of lateral collateral ligament of right knee, initial encounter: Secondary | ICD-10-CM

## 2020-10-19 DIAGNOSIS — S83511A Sprain of anterior cruciate ligament of right knee, initial encounter: Secondary | ICD-10-CM | POA: Insufficient documentation

## 2020-10-19 HISTORY — DX: Sprain of lateral collateral ligament of right knee, initial encounter: S83.421A

## 2020-10-19 HISTORY — DX: Unspecified injury of right lower leg, initial encounter: S89.91XA

## 2020-10-19 HISTORY — DX: Sprain of anterior cruciate ligament of right knee, initial encounter: S83.511A

## 2020-10-19 NOTE — Progress Notes (Deleted)
Follow-up  Encounter Diagnoses  Name Primary?   PCL injury, right, subsequent encounter Yes   Rupture of anterior cruciate ligament of right knee, subsequent encounter    Sprain of lateral collateral ligament of right knee, subsequent encounter    Nicotine dependence, cigarettes, uncomplicated     65 year old male with multiple ligament injury right knee has had physical therapy and playmaker bracing comes in for follow-up

## 2020-11-15 DIAGNOSIS — G4701 Insomnia due to medical condition: Secondary | ICD-10-CM | POA: Diagnosis not present

## 2020-11-15 DIAGNOSIS — G894 Chronic pain syndrome: Secondary | ICD-10-CM | POA: Diagnosis not present

## 2020-11-15 DIAGNOSIS — M5136 Other intervertebral disc degeneration, lumbar region: Secondary | ICD-10-CM | POA: Diagnosis not present

## 2020-12-01 DIAGNOSIS — Z0100 Encounter for examination of eyes and vision without abnormal findings: Secondary | ICD-10-CM | POA: Diagnosis not present

## 2020-12-01 DIAGNOSIS — I10 Essential (primary) hypertension: Secondary | ICD-10-CM | POA: Diagnosis not present

## 2020-12-01 DIAGNOSIS — H524 Presbyopia: Secondary | ICD-10-CM | POA: Diagnosis not present

## 2020-12-01 DIAGNOSIS — G894 Chronic pain syndrome: Secondary | ICD-10-CM | POA: Diagnosis not present

## 2020-12-01 DIAGNOSIS — M1991 Primary osteoarthritis, unspecified site: Secondary | ICD-10-CM | POA: Diagnosis not present

## 2020-12-01 DIAGNOSIS — J449 Chronic obstructive pulmonary disease, unspecified: Secondary | ICD-10-CM | POA: Diagnosis not present

## 2021-01-03 DIAGNOSIS — G8929 Other chronic pain: Secondary | ICD-10-CM | POA: Diagnosis not present

## 2021-01-03 DIAGNOSIS — F329 Major depressive disorder, single episode, unspecified: Secondary | ICD-10-CM | POA: Diagnosis not present

## 2021-01-03 DIAGNOSIS — K219 Gastro-esophageal reflux disease without esophagitis: Secondary | ICD-10-CM | POA: Diagnosis not present

## 2021-01-03 DIAGNOSIS — J449 Chronic obstructive pulmonary disease, unspecified: Secondary | ICD-10-CM | POA: Diagnosis not present

## 2021-01-03 DIAGNOSIS — E782 Mixed hyperlipidemia: Secondary | ICD-10-CM | POA: Diagnosis not present

## 2021-01-03 DIAGNOSIS — M1991 Primary osteoarthritis, unspecified site: Secondary | ICD-10-CM | POA: Diagnosis not present

## 2021-01-03 DIAGNOSIS — I10 Essential (primary) hypertension: Secondary | ICD-10-CM | POA: Diagnosis not present

## 2021-01-15 DIAGNOSIS — L309 Dermatitis, unspecified: Secondary | ICD-10-CM | POA: Diagnosis not present

## 2021-01-15 DIAGNOSIS — Z681 Body mass index (BMI) 19 or less, adult: Secondary | ICD-10-CM | POA: Diagnosis not present

## 2021-01-15 DIAGNOSIS — G894 Chronic pain syndrome: Secondary | ICD-10-CM | POA: Diagnosis not present

## 2021-02-02 DIAGNOSIS — J449 Chronic obstructive pulmonary disease, unspecified: Secondary | ICD-10-CM | POA: Diagnosis not present

## 2021-02-02 DIAGNOSIS — M1991 Primary osteoarthritis, unspecified site: Secondary | ICD-10-CM | POA: Diagnosis not present

## 2021-02-12 DIAGNOSIS — J449 Chronic obstructive pulmonary disease, unspecified: Secondary | ICD-10-CM | POA: Diagnosis not present

## 2021-02-12 DIAGNOSIS — I10 Essential (primary) hypertension: Secondary | ICD-10-CM | POA: Diagnosis not present

## 2021-02-12 DIAGNOSIS — G894 Chronic pain syndrome: Secondary | ICD-10-CM | POA: Diagnosis not present

## 2021-02-12 DIAGNOSIS — M1991 Primary osteoarthritis, unspecified site: Secondary | ICD-10-CM | POA: Diagnosis not present

## 2021-03-05 DIAGNOSIS — M1991 Primary osteoarthritis, unspecified site: Secondary | ICD-10-CM | POA: Diagnosis not present

## 2021-03-05 DIAGNOSIS — J449 Chronic obstructive pulmonary disease, unspecified: Secondary | ICD-10-CM | POA: Diagnosis not present

## 2021-03-08 DIAGNOSIS — Z681 Body mass index (BMI) 19 or less, adult: Secondary | ICD-10-CM | POA: Diagnosis not present

## 2021-03-08 DIAGNOSIS — J449 Chronic obstructive pulmonary disease, unspecified: Secondary | ICD-10-CM | POA: Diagnosis not present

## 2021-03-08 DIAGNOSIS — I10 Essential (primary) hypertension: Secondary | ICD-10-CM | POA: Diagnosis not present

## 2021-03-08 DIAGNOSIS — M1991 Primary osteoarthritis, unspecified site: Secondary | ICD-10-CM | POA: Diagnosis not present

## 2021-03-08 DIAGNOSIS — G894 Chronic pain syndrome: Secondary | ICD-10-CM | POA: Diagnosis not present

## 2021-04-04 DIAGNOSIS — J449 Chronic obstructive pulmonary disease, unspecified: Secondary | ICD-10-CM | POA: Diagnosis not present

## 2021-04-04 DIAGNOSIS — M1991 Primary osteoarthritis, unspecified site: Secondary | ICD-10-CM | POA: Diagnosis not present

## 2021-04-09 DIAGNOSIS — M1991 Primary osteoarthritis, unspecified site: Secondary | ICD-10-CM | POA: Diagnosis not present

## 2021-04-09 DIAGNOSIS — G4701 Insomnia due to medical condition: Secondary | ICD-10-CM | POA: Diagnosis not present

## 2021-04-09 DIAGNOSIS — J449 Chronic obstructive pulmonary disease, unspecified: Secondary | ICD-10-CM | POA: Diagnosis not present

## 2021-04-09 DIAGNOSIS — F329 Major depressive disorder, single episode, unspecified: Secondary | ICD-10-CM | POA: Diagnosis not present

## 2021-05-04 DIAGNOSIS — M1991 Primary osteoarthritis, unspecified site: Secondary | ICD-10-CM | POA: Diagnosis not present

## 2021-05-04 DIAGNOSIS — J449 Chronic obstructive pulmonary disease, unspecified: Secondary | ICD-10-CM | POA: Diagnosis not present

## 2021-05-08 DIAGNOSIS — G894 Chronic pain syndrome: Secondary | ICD-10-CM | POA: Diagnosis not present

## 2021-05-08 DIAGNOSIS — Z681 Body mass index (BMI) 19 or less, adult: Secondary | ICD-10-CM | POA: Diagnosis not present

## 2021-05-08 DIAGNOSIS — M5136 Other intervertebral disc degeneration, lumbar region: Secondary | ICD-10-CM | POA: Diagnosis not present

## 2021-05-08 DIAGNOSIS — M1991 Primary osteoarthritis, unspecified site: Secondary | ICD-10-CM | POA: Diagnosis not present

## 2021-05-08 DIAGNOSIS — R030 Elevated blood-pressure reading, without diagnosis of hypertension: Secondary | ICD-10-CM | POA: Diagnosis not present

## 2021-05-14 DIAGNOSIS — E782 Mixed hyperlipidemia: Secondary | ICD-10-CM | POA: Diagnosis not present

## 2021-05-14 DIAGNOSIS — I10 Essential (primary) hypertension: Secondary | ICD-10-CM | POA: Diagnosis not present

## 2021-05-14 DIAGNOSIS — G4701 Insomnia due to medical condition: Secondary | ICD-10-CM | POA: Diagnosis not present

## 2021-05-14 DIAGNOSIS — G8929 Other chronic pain: Secondary | ICD-10-CM | POA: Diagnosis not present

## 2021-05-14 DIAGNOSIS — Z0000 Encounter for general adult medical examination without abnormal findings: Secondary | ICD-10-CM | POA: Diagnosis not present

## 2021-05-14 DIAGNOSIS — F329 Major depressive disorder, single episode, unspecified: Secondary | ICD-10-CM | POA: Diagnosis not present

## 2021-05-14 DIAGNOSIS — J449 Chronic obstructive pulmonary disease, unspecified: Secondary | ICD-10-CM | POA: Diagnosis not present

## 2021-05-14 DIAGNOSIS — G894 Chronic pain syndrome: Secondary | ICD-10-CM | POA: Diagnosis not present

## 2021-05-14 DIAGNOSIS — K219 Gastro-esophageal reflux disease without esophagitis: Secondary | ICD-10-CM | POA: Diagnosis not present

## 2021-05-18 DIAGNOSIS — M5416 Radiculopathy, lumbar region: Secondary | ICD-10-CM | POA: Diagnosis not present

## 2021-05-18 DIAGNOSIS — I10 Essential (primary) hypertension: Secondary | ICD-10-CM | POA: Diagnosis not present

## 2021-05-21 ENCOUNTER — Other Ambulatory Visit: Payer: Self-pay | Admitting: Neurosurgery

## 2021-05-21 ENCOUNTER — Other Ambulatory Visit (HOSPITAL_COMMUNITY): Payer: Self-pay | Admitting: Neurosurgery

## 2021-05-21 DIAGNOSIS — M5416 Radiculopathy, lumbar region: Secondary | ICD-10-CM

## 2021-06-01 ENCOUNTER — Ambulatory Visit (HOSPITAL_COMMUNITY)
Admission: RE | Admit: 2021-06-01 | Discharge: 2021-06-01 | Disposition: A | Discharge status: Home / Self Care | Payer: Medicare HMO | Source: Ambulatory Visit | Attending: Neurosurgery | Admitting: Neurosurgery

## 2021-06-01 ENCOUNTER — Other Ambulatory Visit: Payer: Self-pay

## 2021-06-01 DIAGNOSIS — M5127 Other intervertebral disc displacement, lumbosacral region: Secondary | ICD-10-CM | POA: Diagnosis not present

## 2021-06-01 DIAGNOSIS — M5416 Radiculopathy, lumbar region: Secondary | ICD-10-CM | POA: Insufficient documentation

## 2021-06-01 DIAGNOSIS — M47816 Spondylosis without myelopathy or radiculopathy, lumbar region: Secondary | ICD-10-CM | POA: Diagnosis not present

## 2021-06-07 DIAGNOSIS — Z681 Body mass index (BMI) 19 or less, adult: Secondary | ICD-10-CM | POA: Diagnosis not present

## 2021-06-07 DIAGNOSIS — M1991 Primary osteoarthritis, unspecified site: Secondary | ICD-10-CM | POA: Diagnosis not present

## 2021-06-07 DIAGNOSIS — M5136 Other intervertebral disc degeneration, lumbar region: Secondary | ICD-10-CM | POA: Diagnosis not present

## 2021-06-07 DIAGNOSIS — G894 Chronic pain syndrome: Secondary | ICD-10-CM | POA: Diagnosis not present

## 2021-06-07 DIAGNOSIS — I10 Essential (primary) hypertension: Secondary | ICD-10-CM | POA: Diagnosis not present

## 2021-07-04 DIAGNOSIS — M1991 Primary osteoarthritis, unspecified site: Secondary | ICD-10-CM | POA: Diagnosis not present

## 2021-07-04 DIAGNOSIS — G894 Chronic pain syndrome: Secondary | ICD-10-CM | POA: Diagnosis not present

## 2021-07-04 DIAGNOSIS — M5136 Other intervertebral disc degeneration, lumbar region: Secondary | ICD-10-CM | POA: Diagnosis not present

## 2021-07-28 IMAGING — DX DG KNEE COMPLETE 4+V*R*
4 series · 4 of 4 positions shown · non-contrast
Comparison: None

CLINICAL DATA: Swelling and bruising RIGHT knee, sprain RIGHT knee
initial encounter, dropped a tractor trailer tire on his RIGHT knee

EXAM:
RIGHT KNEE - COMPLETE 4+ VIEW

[knee ap]
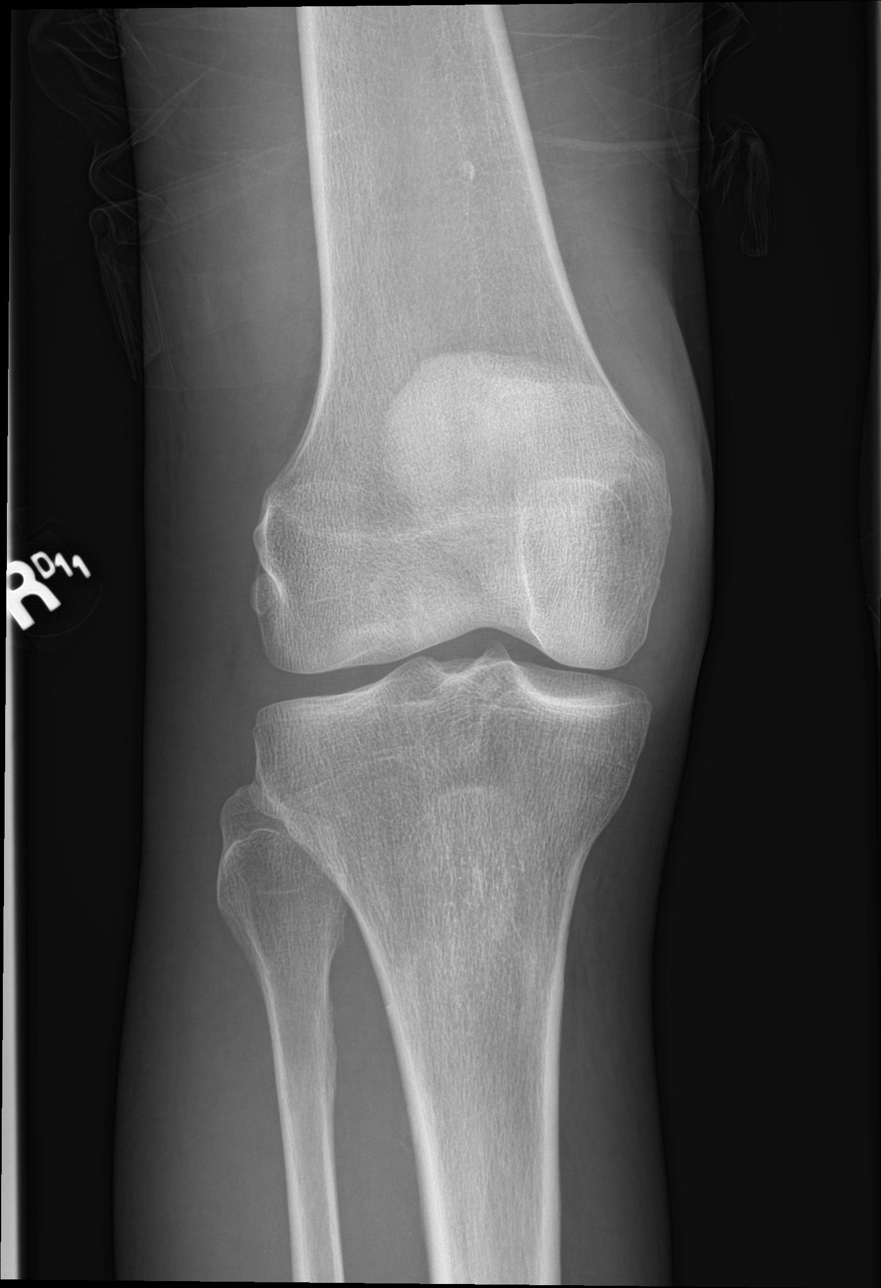

[knee lat]
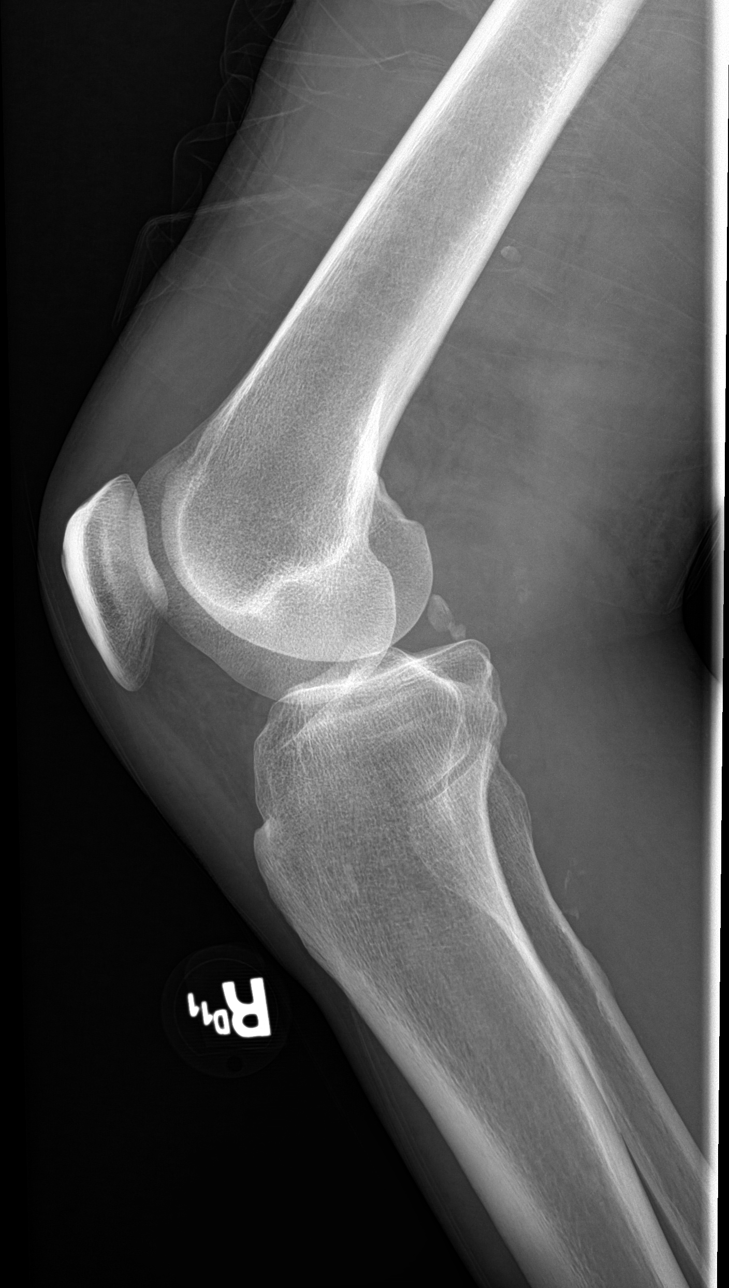

[knee obl (1 of 2)]
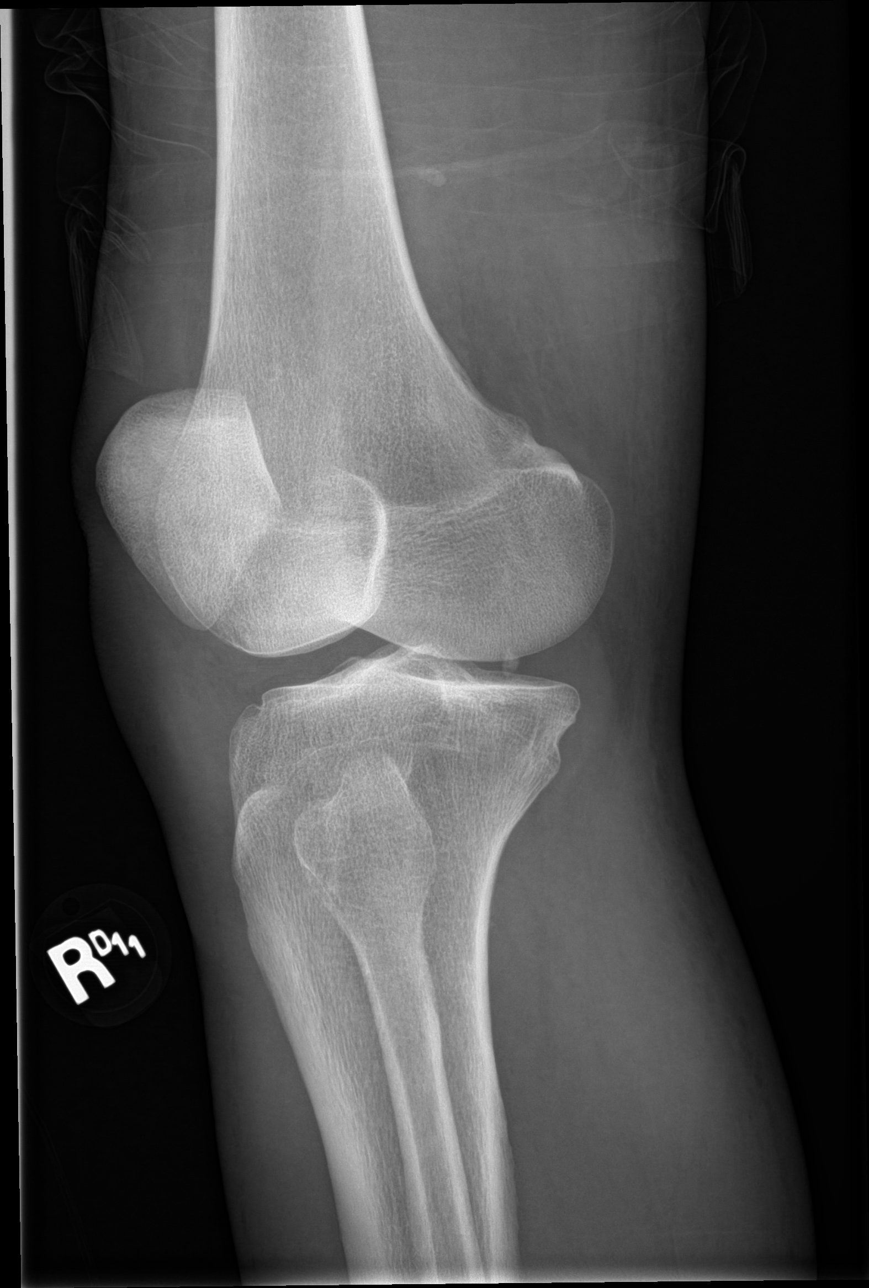

[knee obl (2 of 2)]
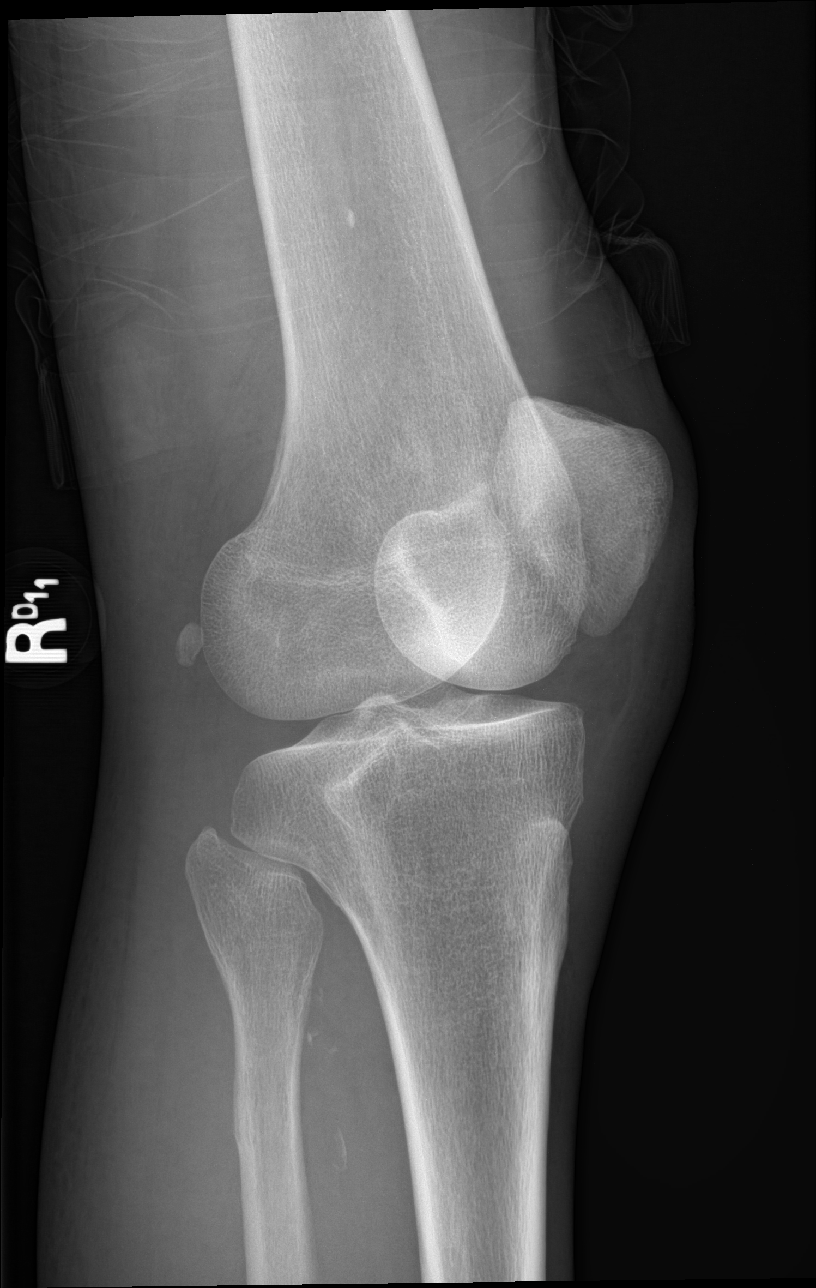

[4 of 4 positions shown; findings below may reference images not displayed]

FINDINGS: Osseous mineralization normal.

Joint spaces preserved.

Anterior and medial soft tissue swelling.

No acute fracture, dislocation, or bone destruction.

No joint effusion.
IMPRESSION: No acute osseous abnormalities.

## 2021-08-06 DIAGNOSIS — J449 Chronic obstructive pulmonary disease, unspecified: Secondary | ICD-10-CM | POA: Diagnosis not present

## 2021-08-06 DIAGNOSIS — I10 Essential (primary) hypertension: Secondary | ICD-10-CM | POA: Diagnosis not present

## 2021-08-06 DIAGNOSIS — M5136 Other intervertebral disc degeneration, lumbar region: Secondary | ICD-10-CM | POA: Diagnosis not present

## 2021-08-06 DIAGNOSIS — G894 Chronic pain syndrome: Secondary | ICD-10-CM | POA: Diagnosis not present

## 2021-09-24 IMAGING — MR MR KNEE*R* W/O CM
7 series · 40 of 40 positions shown · non-contrast
Comparison: Knee radiographs 06/07/2020

CLINICAL DATA: Right knee pain for 2 months after a tire fell on
the leg.

EXAM:
MRI OF THE RIGHT KNEE WITHOUT CONTRAST
TECHNIQUE: Multiplanar, multisequence MR imaging of the knee was performed. No
intravenous contrast was administered.

[Series 8: T2 fat-sat · axial · right · 4.0mm · 0.47mm/px · z∈[-78,+56]mm · 8 of 28 slices shown (1 of 3)]
[im 1/28]
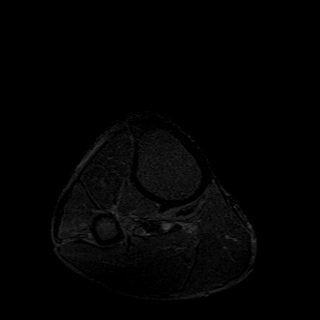
[im 4/28]
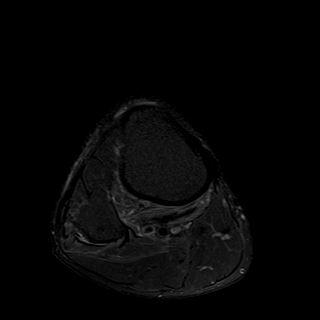
[im 8/28]
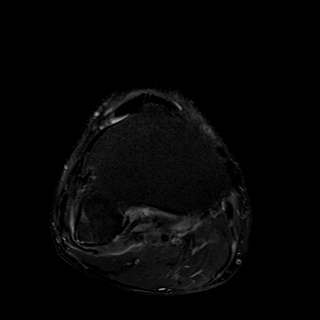
[im 12/28]
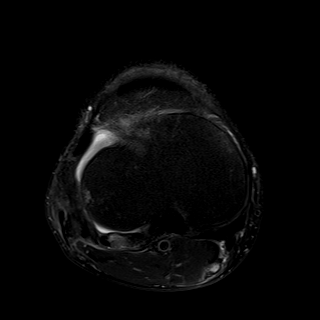
[im 16/28]
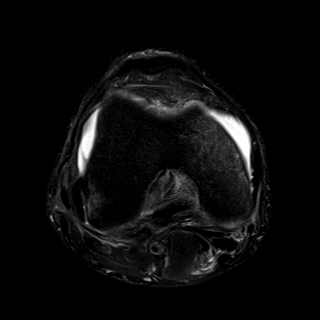
[im 20/28]
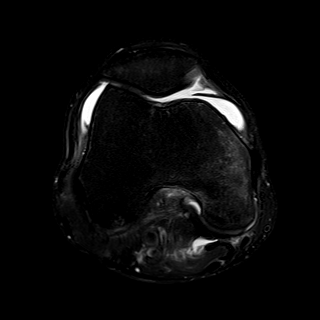
[im 24/28]
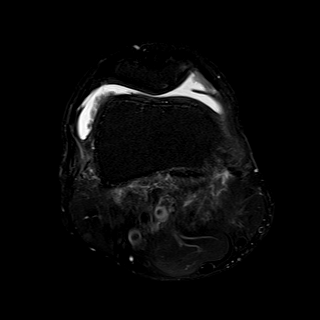
[im 28/28]
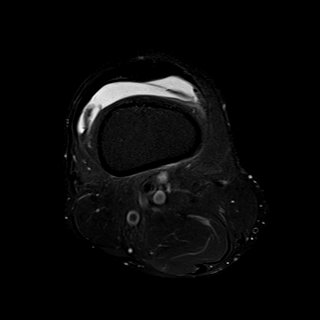

[Series 9: T1 · coronal · right · 4.0mm · 0.59mm/px · 6 of 22 slices shown]
[im 1/22]
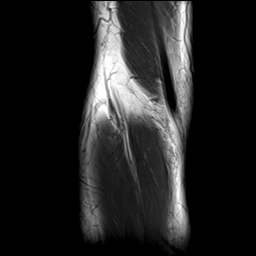
[im 5/22]
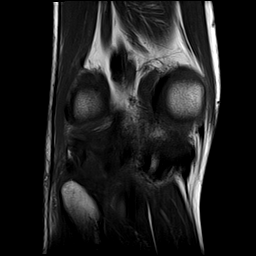
[im 9/22]
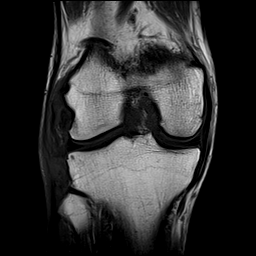
[im 13/22]
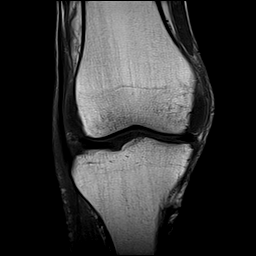
[im 17/22]
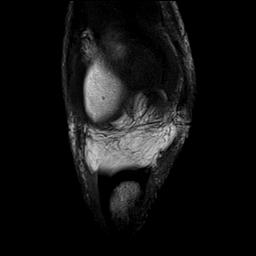
[im 22/22]
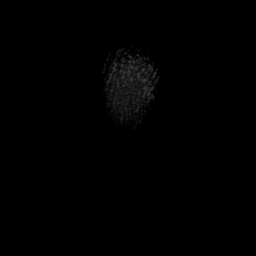

[Series 10: T2 fat-sat · coronal · right · 4.0mm · 0.59mm/px · 5 of 22 slices shown (2 of 3)]
[im 1/22]
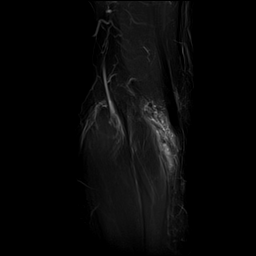
[im 6/22]
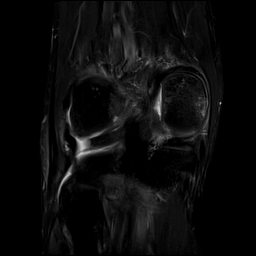
[im 11/22]
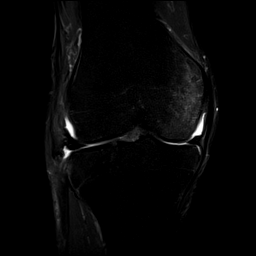
[im 16/22]
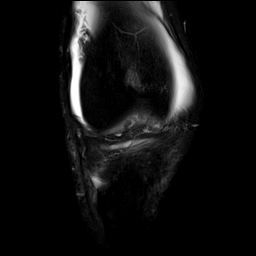
[im 22/22]
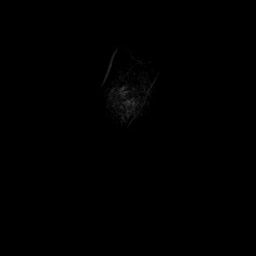

[Series 11: PD fat-sat · coronal · right · 4.0mm · 0.59mm/px · 5 of 22 slices shown (1 of 2)]
[im 1/22]
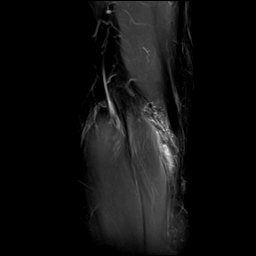
[im 6/22]
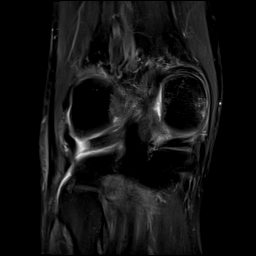
[im 11/22]
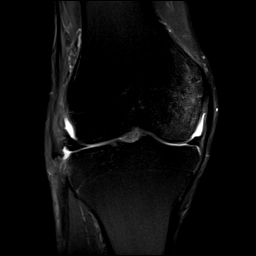
[im 16/22]
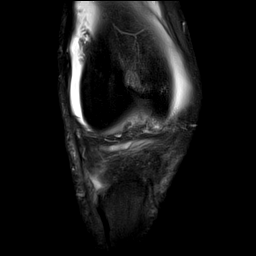
[im 22/22]
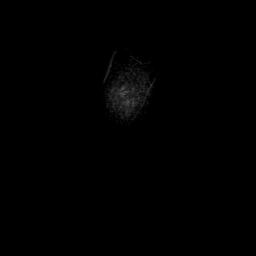

[Series 12: PD fat-sat · sagittal · right · 3.0mm · 0.55mm/px · 6 of 26 slices shown (2 of 2)]
[im 1/26]
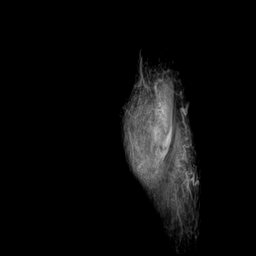
[im 6/26]
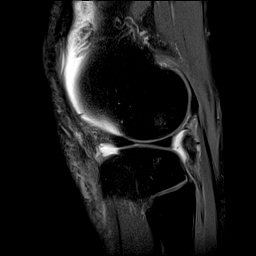
[im 11/26]
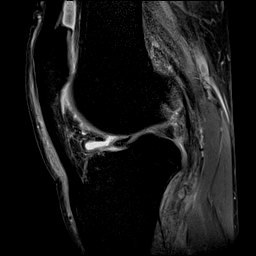
[im 16/26]
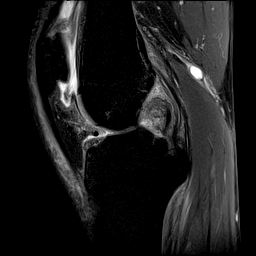
[im 21/26]
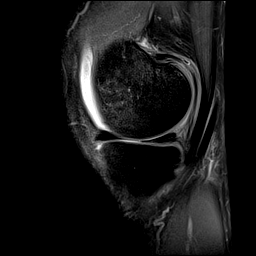
[im 26/26]
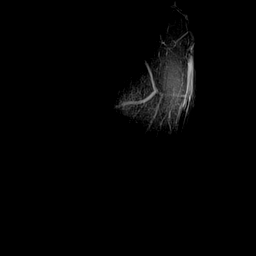

[Series 13: T2 fat-sat · sagittal · right · 3.0mm · 0.55mm/px · 6 of 25 slices shown (3 of 3)]
[im 1/25]
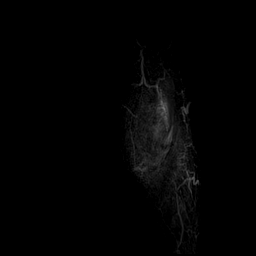
[im 5/25]
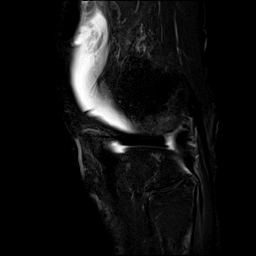
[im 10/25]
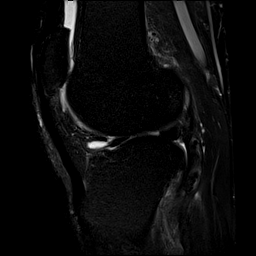
[im 15/25]
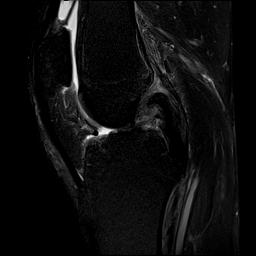
[im 20/25]
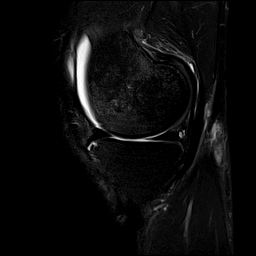
[im 25/25]
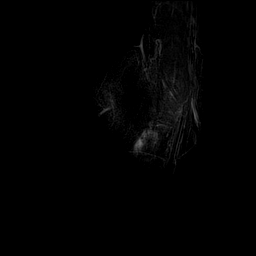

[Series 14: PD · coronal · right · 2.0mm · 0.47mm/px · 4 of 16 slices shown]
[im 1/16]
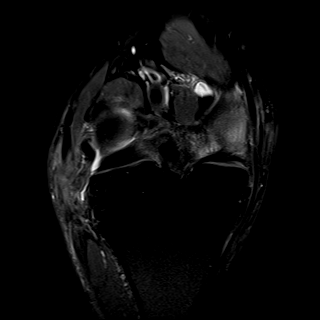
[im 6/16]
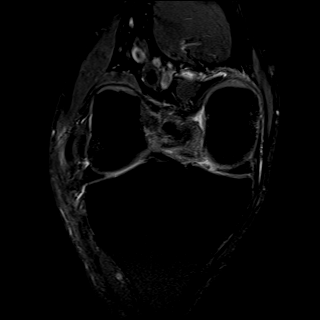
[im 11/16]
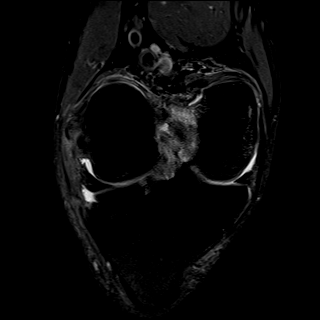
[im 16/16]
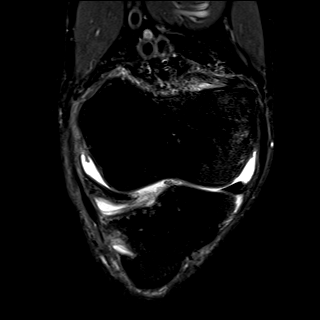

[40 of 40 positions shown; findings below may reference images not displayed]

FINDINGS: MENISCI

Medial meniscus: Minimal free edge fraying of the posterior horn
near the midbody for example on image 20 of series 12 without a
well-defined tear.

Lateral meniscus:  Unremarkable

LIGAMENTS

Cruciates: Irregular and somewhat indistinct ACL with at least some
of the fibers having a slope less than that of Blumensaat's line,
compatible with least partial tearing. Proximal PCL accentuated
signal likewise with poor definition of the PCL attachment
proximally suspicious for high-grade partial tearing of the proximal
PCL for example on image 16 series 13.

Collaterals: Abnormal accentuated signal in the proximal fibular
collateral ligament on image 9 of series 10, with ill definition of
the fibular collateral ligament distally suspicious for tear. The
MCL appears intact

CARTILAGE

Patellofemoral:  Unremarkable

Medial:  Moderate chondral thinning.

Lateral:  Unremarkable

Joint: Moderate knee effusion with thickened medial plica. Mild
synovitis.

Popliteal Fossa: Small Baker's cyst. Trace pes anserine bursitis.
Abnormal appearance of the proximal popliteus myotendinous junction
on images 15 through 19 of series 8, suggesting musculotendinous
tear.

Extensor Mechanism:  Mild prepatellar subcutaneous edema.

Bones: Mild marrow edema posterolaterally along the lateral femoral
condyle, and more notable marrow edema medially and anteromedially
along the medial femoral condyle.

Other: No supplemental non-categorized findings.
IMPRESSION: 1. Somewhat unusual constellation of findings, including full
thickness tear or at least partial tearing of the ACL (correlate
with anterior drawer sign); proximal PCL partial tearing; tearing of
the popliteus tendon along the myotendinous junction; lax and
indistinct fibular collateral ligament concerning for fibular
collateral ligament tear; and marrow edema medially in the medial
femoral condyle and posterolaterally in the lateral femoral condyle.
2. Moderate degenerative chondral thinning in the medial
compartment.
3. Moderate knee effusion with thickened medial plica and mild
synovitis.
4. Small Baker's cyst and trace pes anserine bursitis.

## 2021-10-03 DIAGNOSIS — M1991 Primary osteoarthritis, unspecified site: Secondary | ICD-10-CM | POA: Diagnosis not present

## 2021-10-03 DIAGNOSIS — J449 Chronic obstructive pulmonary disease, unspecified: Secondary | ICD-10-CM | POA: Diagnosis not present

## 2021-11-24 ENCOUNTER — Other Ambulatory Visit: Payer: Self-pay

## 2021-11-24 ENCOUNTER — Emergency Department
Admission: EM | Admit: 2021-11-24 | Discharge: 2021-11-24 | Disposition: A | Discharge status: Home / Self Care | Payer: Medicare HMO | Attending: Emergency Medicine | Admitting: Emergency Medicine

## 2021-11-24 DIAGNOSIS — M25561 Pain in right knee: Secondary | ICD-10-CM | POA: Diagnosis not present

## 2021-11-24 DIAGNOSIS — Z5321 Procedure and treatment not carried out due to patient leaving prior to being seen by health care provider: Secondary | ICD-10-CM | POA: Insufficient documentation

## 2021-11-24 DIAGNOSIS — W01198A Fall on same level from slipping, tripping and stumbling with subsequent striking against other object, initial encounter: Secondary | ICD-10-CM | POA: Insufficient documentation

## 2021-11-24 DIAGNOSIS — R55 Syncope and collapse: Secondary | ICD-10-CM | POA: Insufficient documentation

## 2021-11-24 LAB — BASIC METABOLIC PANEL
Anion gap: 7 (ref 5–15)
BUN: 22 mg/dL (ref 8–23)
CO2: 24 mmol/L (ref 22–32)
Calcium: 9.3 mg/dL (ref 8.9–10.3)
Chloride: 106 mmol/L (ref 98–111)
Creatinine, Ser: 1.02 mg/dL (ref 0.61–1.24)
GFR, Estimated: >60 mL/min (ref >60)
Glucose, Bld: 158 mg/dL — ABNORMAL HIGH (ref 70–99)
Potassium: 4.1 mmol/L (ref 3.5–5.1)
Sodium: 137 mmol/L (ref 135–145)

## 2021-11-24 LAB — CBC
HCT: 40.6 % (ref 39.0–52.0)
Hemoglobin: 13.1 g/dL (ref 13.0–17.0)
MCH: 29 pg (ref 26.0–34.0)
MCHC: 32.3 g/dL (ref 30.0–36.0)
MCV: 90 fL (ref 80.0–100.0)
Platelets: 395 10*3/uL (ref 150–400)
RBC: 4.51 MIL/uL (ref 4.22–5.81)
RDW: 14.8 % (ref 11.5–15.5)
WBC: 11.5 10*3/uL — ABNORMAL HIGH (ref 4.0–10.5)
nRBC: 0 % (ref 0.0–0.2)

## 2021-11-24 NOTE — ED Triage Notes (Signed)
Patient to ER via POV, reports two syncopal episodes two days ago, one witnessed. Ground-level fall. States that this is a problem that has been happening often and has a scheduled appointment on Monday to be evaluated. States that when he fell initially his right leg was stuck under him. Complaints of right foot/ankle/knee pain.   Reports hitting head, denies blood thinner usage.

## 2021-11-26 DIAGNOSIS — G894 Chronic pain syndrome: Secondary | ICD-10-CM | POA: Diagnosis not present

## 2021-11-26 DIAGNOSIS — M5136 Other intervertebral disc degeneration, lumbar region: Secondary | ICD-10-CM | POA: Diagnosis not present

## 2021-11-26 DIAGNOSIS — I10 Essential (primary) hypertension: Secondary | ICD-10-CM | POA: Diagnosis not present

## 2021-11-26 DIAGNOSIS — J449 Chronic obstructive pulmonary disease, unspecified: Secondary | ICD-10-CM | POA: Diagnosis not present

## 2021-12-03 DIAGNOSIS — H524 Presbyopia: Secondary | ICD-10-CM | POA: Diagnosis not present

## 2021-12-03 DIAGNOSIS — Z0100 Encounter for examination of eyes and vision without abnormal findings: Secondary | ICD-10-CM | POA: Diagnosis not present

## 2022-01-04 DIAGNOSIS — M1991 Primary osteoarthritis, unspecified site: Secondary | ICD-10-CM | POA: Diagnosis not present

## 2022-01-04 DIAGNOSIS — J449 Chronic obstructive pulmonary disease, unspecified: Secondary | ICD-10-CM | POA: Diagnosis not present

## 2022-01-04 DIAGNOSIS — G894 Chronic pain syndrome: Secondary | ICD-10-CM | POA: Diagnosis not present

## 2022-01-04 DIAGNOSIS — I10 Essential (primary) hypertension: Secondary | ICD-10-CM | POA: Diagnosis not present

## 2022-01-25 DIAGNOSIS — Z23 Encounter for immunization: Secondary | ICD-10-CM | POA: Diagnosis not present

## 2022-02-02 DIAGNOSIS — J449 Chronic obstructive pulmonary disease, unspecified: Secondary | ICD-10-CM | POA: Diagnosis not present

## 2022-02-02 DIAGNOSIS — M1991 Primary osteoarthritis, unspecified site: Secondary | ICD-10-CM | POA: Diagnosis not present

## 2022-02-11 DIAGNOSIS — G894 Chronic pain syndrome: Secondary | ICD-10-CM | POA: Diagnosis not present

## 2022-02-20 DIAGNOSIS — J449 Chronic obstructive pulmonary disease, unspecified: Secondary | ICD-10-CM | POA: Diagnosis not present

## 2022-02-20 DIAGNOSIS — M1991 Primary osteoarthritis, unspecified site: Secondary | ICD-10-CM | POA: Diagnosis not present

## 2022-02-20 DIAGNOSIS — Z681 Body mass index (BMI) 19 or less, adult: Secondary | ICD-10-CM | POA: Diagnosis not present

## 2022-02-20 DIAGNOSIS — M5136 Other intervertebral disc degeneration, lumbar region: Secondary | ICD-10-CM | POA: Diagnosis not present

## 2022-02-20 DIAGNOSIS — I10 Essential (primary) hypertension: Secondary | ICD-10-CM | POA: Diagnosis not present

## 2022-02-20 DIAGNOSIS — G894 Chronic pain syndrome: Secondary | ICD-10-CM | POA: Diagnosis not present

## 2022-03-20 DIAGNOSIS — M1991 Primary osteoarthritis, unspecified site: Secondary | ICD-10-CM | POA: Diagnosis not present

## 2022-03-20 DIAGNOSIS — G894 Chronic pain syndrome: Secondary | ICD-10-CM | POA: Diagnosis not present

## 2022-03-20 DIAGNOSIS — J329 Chronic sinusitis, unspecified: Secondary | ICD-10-CM | POA: Diagnosis not present

## 2022-03-20 DIAGNOSIS — J441 Chronic obstructive pulmonary disease with (acute) exacerbation: Secondary | ICD-10-CM | POA: Diagnosis not present

## 2022-04-18 DIAGNOSIS — J329 Chronic sinusitis, unspecified: Secondary | ICD-10-CM | POA: Diagnosis not present

## 2022-04-18 DIAGNOSIS — J441 Chronic obstructive pulmonary disease with (acute) exacerbation: Secondary | ICD-10-CM | POA: Diagnosis not present

## 2022-04-18 DIAGNOSIS — J449 Chronic obstructive pulmonary disease, unspecified: Secondary | ICD-10-CM | POA: Diagnosis not present

## 2022-04-18 DIAGNOSIS — I10 Essential (primary) hypertension: Secondary | ICD-10-CM | POA: Diagnosis not present

## 2022-04-18 DIAGNOSIS — M1991 Primary osteoarthritis, unspecified site: Secondary | ICD-10-CM | POA: Diagnosis not present

## 2022-04-18 DIAGNOSIS — M5136 Other intervertebral disc degeneration, lumbar region: Secondary | ICD-10-CM | POA: Diagnosis not present

## 2022-04-18 DIAGNOSIS — Z681 Body mass index (BMI) 19 or less, adult: Secondary | ICD-10-CM | POA: Diagnosis not present

## 2022-05-16 DIAGNOSIS — J329 Chronic sinusitis, unspecified: Secondary | ICD-10-CM | POA: Diagnosis not present

## 2022-05-16 DIAGNOSIS — J449 Chronic obstructive pulmonary disease, unspecified: Secondary | ICD-10-CM | POA: Diagnosis not present

## 2022-05-16 DIAGNOSIS — I10 Essential (primary) hypertension: Secondary | ICD-10-CM | POA: Diagnosis not present

## 2022-05-16 DIAGNOSIS — M1991 Primary osteoarthritis, unspecified site: Secondary | ICD-10-CM | POA: Diagnosis not present

## 2022-05-16 DIAGNOSIS — Z681 Body mass index (BMI) 19 or less, adult: Secondary | ICD-10-CM | POA: Diagnosis not present

## 2022-06-18 DIAGNOSIS — G894 Chronic pain syndrome: Secondary | ICD-10-CM | POA: Diagnosis not present

## 2022-06-18 DIAGNOSIS — R55 Syncope and collapse: Secondary | ICD-10-CM | POA: Diagnosis not present

## 2022-06-18 DIAGNOSIS — J449 Chronic obstructive pulmonary disease, unspecified: Secondary | ICD-10-CM | POA: Diagnosis not present

## 2022-06-18 DIAGNOSIS — I10 Essential (primary) hypertension: Secondary | ICD-10-CM | POA: Diagnosis not present

## 2022-07-04 ENCOUNTER — Ambulatory Visit: Payer: Medicare HMO | Attending: Nurse Practitioner | Admitting: Nurse Practitioner

## 2022-07-04 ENCOUNTER — Encounter: Payer: Self-pay | Admitting: Nurse Practitioner

## 2022-07-04 VITALS — BP 118/66 | HR 76 | Ht 68.0 in | Wt 126.8 lb

## 2022-07-04 DIAGNOSIS — I951 Orthostatic hypotension: Secondary | ICD-10-CM | POA: Diagnosis not present

## 2022-07-04 DIAGNOSIS — R0989 Other specified symptoms and signs involving the circulatory and respiratory systems: Secondary | ICD-10-CM | POA: Diagnosis not present

## 2022-07-04 DIAGNOSIS — E782 Mixed hyperlipidemia: Secondary | ICD-10-CM | POA: Diagnosis not present

## 2022-07-04 DIAGNOSIS — R0789 Other chest pain: Secondary | ICD-10-CM | POA: Diagnosis not present

## 2022-07-04 DIAGNOSIS — Z720 Tobacco use: Secondary | ICD-10-CM | POA: Diagnosis not present

## 2022-07-04 DIAGNOSIS — J449 Chronic obstructive pulmonary disease, unspecified: Secondary | ICD-10-CM | POA: Diagnosis not present

## 2022-07-04 DIAGNOSIS — R011 Cardiac murmur, unspecified: Secondary | ICD-10-CM

## 2022-07-04 DIAGNOSIS — I10 Essential (primary) hypertension: Secondary | ICD-10-CM | POA: Diagnosis not present

## 2022-07-04 DIAGNOSIS — R55 Syncope and collapse: Secondary | ICD-10-CM | POA: Diagnosis not present

## 2022-07-04 NOTE — Progress Notes (Signed)
Office Visit    Patient Name: Jamie Lam Date of Encounter: 07/04/2022  Primary Care Provider:  Redmond School, MD Primary Cardiologist:  Carlyle Dolly, MD  Chief Complaint    67 year old male with a history of atypical chest pain, family history of CAD, COPD, tobacco abuse, heart murmur, and hyperlipidemia, who presents for follow-up related to chest wall pain and orthostasis.  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 10/2019 Echo: EF 60-65%, no rwma, nl RV fxn, triv AI.   Mixed hyperlipidemia    Tobacco abuse    Past Surgical History:  Procedure Laterality Date   BACK SURGERY     COLONOSCOPY  2009   Dr. Oneida Alar: rare sigmoid diverticula, otherwise no polyps, masses, inflammatory changes, or AVMS.    ESOPHAGOGASTRODUODENOSCOPY  2007   non-erosive antral gastirtis, positive H.pylori serology, reportedly treated with Prevpac    Allergies  Allergies  Allergen Reactions   Abilify [Aripiprazole] Other (See Comments)    "makes me feel crazy"   Azithromycin Hives   Varenicline Tartrate     History of Present Illness    67 year old male with a history of atypical chest pain, family history of CAD, COPD, tobacco abuse, hyperlipidemia, and heart murmur.  He was last seen in May 2021, at which time he reported chronic dyspnea on exertion without chest pain.  He was still smoking a pack and a half a day.  Echocardiogram was performed and showed an EF of 60 to 65% with trivial AI.  Stress testing was deferred.  ED notes from July 2023 indicate that he presented there for 2 syncopal episodes.  It appears he left after being seen by triage as there are no additional notes related to the visit.  ECG at the time showed sinus rhythm lab work is notable for  mild leukocytosis.  Approximate 2 weeks ago, patient was in a tree and cutting branches and fell about 7 to 10 feet to the ground, striking his back.  He began to experience left flank and left chest discomfort and tenderness that was worse with upper body movements, position changes, deep breathing, and palpation.  He did not seek attention but because he was having chest pain, he made a cardiology follow-up visit.  He has been taking regularly scheduled hydrocodone-acetaminophen and Zanaflex and over the past week, has had no recurrence of reproducible chest pain/tenderness.  His ECG is normal today.  On further questioning, he mentions episodic orthostatic lightheadedness and prior episodes of syncope like what caused him to present to the ED in July 2023.  About once a month or less, upon standing, he will feel lightheaded and usually he can steady himself and symptoms resolved within seconds.  On a few occasions, he lost consciousness, though not recently.  He has never experienced the symptoms in the absence of recent standing.  He has some degree of chronic dyspnea on exertion which is unchanged.  He denies palpitations, PND, orthopnea, edema, or early satiety.  He continues to smoke a pack and a half of cigarettes a day.  Home Medications    Current Outpatient Medications  Medication Sig Dispense Refill   albuterol (PROVENTIL) 2 MG tablet Take 2 mg by mouth  4 (four) times daily.     albuterol (VENTOLIN HFA) 108 (90 Base) MCG/ACT inhaler Inhale 1-2 puffs into the lungs every 6 (six) hours as needed for wheezing or shortness of breath.      aspirin EC 81 MG tablet Take 81 mg by mouth at bedtime. Swallow whole.     atorvastatin (LIPITOR) 40 MG tablet Take 40 mg by mouth every Monday, Wednesday, and Friday.      B Complex-C (B-COMPLEX WITH VITAMIN C) tablet Take 3 tablets by mouth daily.      Cholecalciferol (VITAMIN D3 PO) Take 2 tablets by mouth daily.     gabapentin (NEURONTIN) 800 MG tablet  Take 800 mg by mouth in the morning, at noon, in the evening, and at bedtime.      HYDROcodone-acetaminophen (NORCO) 10-325 MG per tablet Take 1 tablet by mouth every 4 (four) hours as needed (pain.).      omeprazole (PRILOSEC) 40 MG capsule Take 40 mg by mouth daily before breakfast.      tiZANidine (ZANAFLEX) 4 MG tablet Take 4 mg by mouth in the morning, at noon, in the evening, and at bedtime.      traZODone (DESYREL) 100 MG tablet Take 100 mg by mouth at bedtime.      TRELEGY ELLIPTA 200-62.5-25 MCG/INH AEPB Inhale 1 puff into the lungs daily as needed (high pollen/high temperature (poor air quality)).      zolpidem (AMBIEN) 10 MG tablet Take 10 mg by mouth at bedtime.      levalbuterol (XOPENEX HFA) 45 MCG/ACT inhaler  (Patient not taking: Reported on 07/04/2022)     traMADol (ULTRAM) 50 MG tablet Take 100 mg by mouth every 4 (four) hours as needed (pain.).  (Patient not taking: Reported on 07/04/2022)     No current facility-administered medications for this visit.     Review of Systems    Orthostatic lightheadedness and syncope as outlined above.  Chest wall pain and tenderness following traumatic fall from a tree.  He has some degree of chronic dyspnea on exertion which is unchanged.  He denies palpitations, PND, orthopnea, edema, or early satiety all other systems reviewed and are otherwise negative except as noted above.    Physical Exam    VS:  BP 118/66   Pulse 76   Ht '5\' 8"'$  ([id number] m)   Wt 126 lb 12.8 oz (57.5 kg)   SpO2 98%   BMI 19.28 kg/m  , BMI Body mass index is 19.28 kg/m.    Orthostatic VS for the past 24 hrs:  BP- Lying Pulse- Lying BP- Sitting Pulse- Sitting BP- Standing at 0 minutes Pulse- Standing at 0 minutes  07/04/22 1408 140/74 69 139/75 74 129/67 76   GEN: Well nourished, well developed, in no acute distress. HEENT: normal. Neck: Supple, no JVD or masses.  Right greater than left bilateral carotid bruits. Cardiac: RRR, 2/6 systolic murmur heard loudest at  the upper sternal borders and along the left sternal border, no rubs or gallops. No clubbing, cyanosis, edema.  Radials 2+/PT 2+ and equal bilaterally.  Respiratory:  Respirations regular and unlabored, scattered expiratory wheezing and rhonchi. GI: Soft, nontender, nondistended, BS + x 4. MS: no deformity or atrophy. Skin: warm and dry, no rash. Neuro:  Strength and sensation are intact. Psych: Normal affect.  Accessory Clinical Findings    ECG personally reviewed by me today -regular sinus rhythm, 68- no acute changes.  Lab Results  Component Value Date   WBC 11.5 (H)  11/24/2021   HGB 13.1 11/24/2021   HCT 40.6 11/24/2021   MCV 90.0 11/24/2021   PLT 395 11/24/2021   Lab Results  Component Value Date   CREATININE 1.02 11/24/2021   BUN 22 11/24/2021   NA 137 11/24/2021   K 4.1 11/24/2021   CL 106 11/24/2021   CO2 24 11/24/2021   Lab Results  Component Value Date   ALT 38 08/01/2008   AST 25 08/01/2008   ALKPHOS 172 (H) 08/01/2008   BILITOT 0.6 08/01/2008    Assessment & Plan    1.  Chest wall pain: Patient recently fell from a tree, striking branches on his way down and then landing on his back.  He subsequently developed chest wall pain and tenderness that was worse with palpation, deep breathing, and upper body arm movements and rotation.  He did not seek care for this.  Symptoms have since resolved on his usual home pain medicines and muscle relaxant.  ECG is normal.  He has chronic, stable dyspnea on exertion but denies any prior history of rest or exertional angina.  Will defer ischemic testing at this time.  2.  Orthostatic lightheadedness/syncope: Patient with intermittent orthostatic lightheadedness occurring immediately after standing, lasting a few seconds, typically resolving but on a few occasions, he has lost consciousness.  He is mildly orthostatic by blood pressure today, dropping his systolic blood pressure from 139 sitting to 129 standing.  He was not  symptomatic today.  He does have bilateral carotid bruits on examination as well as a known systolic murmur.  Arrange for follow-up echo and carotid ultrasound.  We did discuss potentially placing an event monitor though he has not had any recent symptoms less likely be low yield.  I strongly encouraged him to be more vigilant when moving from a lying or seated to a standing position, and to ensure adequate hydration.  3.  Bilateral carotid bruits: Follow-up carotid ultrasound.  Notes that he only takes his aspirin and statin about 2 days a week.  Strongly encouraged that he takes these daily.  4.  Systolic murmur: No significant valvular disease noted at the time of echo in 2021.  In the setting of syncope, will repeat echo.  5.  Hyperlipidemia: No recent lipids on file.  He is followed by primary care but only takes his statin a few days a week.  Encouraged him to take this daily.  6.  Tobacco abuse/COPD: Smoking cessation strongly advised however, he is not currently contemplating quitting.  He does have expiratory wheezing on examination and has albuterol at home.  7.  Disposition: Follow-up carotid ultrasound and echo.  Follow-up in clinic in 4 to 6 weeks or sooner if necessary.  Murray Hodgkins, NP 07/04/2022, 5:15 PM

## 2022-07-04 NOTE — Patient Instructions (Signed)
Medication Instructions:  Your physician recommends that you continue on your current medications as directed. Please refer to the Current Medication list given to you today.  *If you need a refill on your cardiac medications before your next appointment, please call your pharmacy*   Lab Work: NONE   If you have labs (blood work) drawn today and your tests are completely normal, you will receive your results only by: [facility] (if you have MyChart) OR A paper copy in the mail If you have any lab test that is abnormal or we need to change your treatment, we will call you to review the results.   Testing/Procedures: Your physician has requested that you have a carotid duplex. This test is an ultrasound of the carotid arteries in your neck. It looks at blood flow through these arteries that supply the brain with blood. Allow one hour for this exam. There are no restrictions or special instructions.   Your physician has requested that you have an echocardiogram. Echocardiography is a painless test that uses sound waves to create images of your heart. It provides your doctor with information about the size and shape of your heart and how well your heart's chambers and valves are working. This procedure takes approximately one hour. There are no restrictions for this procedure. Please do NOT wear cologne, perfume, aftershave, or lotions (deodorant is allowed). Please arrive 15 minutes prior to your appointment time.    Follow-Up: At [facility], you and your health needs are our priority.  As part of our continuing mission to provide you with exceptional heart care, we have created designated Provider Care Teams.  These Care Teams include your primary Cardiologist (physician) and Advanced Practice Providers (APPs -  Physician Assistants and Nurse Practitioners) who all work together to provide you with the care you need, when you need it.  We recommend signing up for the patient  portal called "MyChart".  Sign up information is provided on this After Visit Summary.  MyChart is used to connect with patients for Virtual Visits (Telemedicine).  Patients are able to view lab/test results, encounter notes, upcoming appointments, etc.  Non-urgent messages can be sent to your provider as well.   To learn more about what you can do with MyChart, go to [email or web address].    Your next appointment:   4 -6 week(s)  Provider:   You may see Carlyle Dolly, MD or one of the following Advanced Practice Providers on your designated Care Team:   Bernerd Pho, PA-C  Ermalinda Barrios, [place]     Other Instructions Thank you for choosing [facility]!

## 2022-07-16 ENCOUNTER — Ambulatory Visit (HOSPITAL_COMMUNITY)
Admission: RE | Admit: 2022-07-16 | Discharge: 2022-07-16 | Disposition: A | Discharge status: Home / Self Care | Payer: Medicare HMO | Source: Ambulatory Visit | Attending: Nurse Practitioner | Admitting: Nurse Practitioner

## 2022-07-16 DIAGNOSIS — M1991 Primary osteoarthritis, unspecified site: Secondary | ICD-10-CM | POA: Diagnosis not present

## 2022-07-16 DIAGNOSIS — M5136 Other intervertebral disc degeneration, lumbar region: Secondary | ICD-10-CM | POA: Diagnosis not present

## 2022-07-16 DIAGNOSIS — G894 Chronic pain syndrome: Secondary | ICD-10-CM | POA: Diagnosis not present

## 2022-07-16 DIAGNOSIS — Z125 Encounter for screening for malignant neoplasm of prostate: Secondary | ICD-10-CM | POA: Diagnosis not present

## 2022-07-16 DIAGNOSIS — Z0001 Encounter for general adult medical examination with abnormal findings: Secondary | ICD-10-CM | POA: Diagnosis not present

## 2022-07-16 DIAGNOSIS — R0989 Other specified symptoms and signs involving the circulatory and respiratory systems: Secondary | ICD-10-CM | POA: Insufficient documentation

## 2022-07-16 DIAGNOSIS — I10 Essential (primary) hypertension: Secondary | ICD-10-CM | POA: Diagnosis not present

## 2022-07-16 DIAGNOSIS — Z1331 Encounter for screening for depression: Secondary | ICD-10-CM | POA: Diagnosis not present

## 2022-07-16 DIAGNOSIS — J449 Chronic obstructive pulmonary disease, unspecified: Secondary | ICD-10-CM | POA: Diagnosis not present

## 2022-07-16 DIAGNOSIS — I6523 Occlusion and stenosis of bilateral carotid arteries: Secondary | ICD-10-CM | POA: Diagnosis not present

## 2022-08-05 ENCOUNTER — Ambulatory Visit (HOSPITAL_COMMUNITY)
Admission: RE | Admit: 2022-08-05 | Discharge: 2022-08-05 | Disposition: A | Discharge status: Home / Self Care | Payer: Medicare HMO | Source: Ambulatory Visit | Attending: Nurse Practitioner | Admitting: Nurse Practitioner

## 2022-08-05 DIAGNOSIS — R55 Syncope and collapse: Secondary | ICD-10-CM | POA: Diagnosis not present

## 2022-08-05 LAB — ECHOCARDIOGRAM COMPLETE
AR max vel: 1.75 cm2
AV Area VTI: 1.64 cm2
AV Area mean vel: 1.93 cm2
AV Mean grad: 6 mmHg
AV Peak grad: 13.1 mmHg
Ao pk vel: 1.81 m/s
Area-P 1/2: 2.03 cm2
S' Lateral: 2.9 cm

## 2022-08-05 NOTE — Progress Notes (Signed)
*  PRELIMINARY RESULTS* Echocardiogram 2D Echocardiogram has been performed.  Jamie Lam 08/05/2022, 2:50 PM

## 2022-08-16 DIAGNOSIS — M5136 Other intervertebral disc degeneration, lumbar region: Secondary | ICD-10-CM | POA: Diagnosis not present

## 2022-08-16 DIAGNOSIS — J449 Chronic obstructive pulmonary disease, unspecified: Secondary | ICD-10-CM | POA: Diagnosis not present

## 2022-08-16 DIAGNOSIS — M1991 Primary osteoarthritis, unspecified site: Secondary | ICD-10-CM | POA: Diagnosis not present

## 2022-08-16 DIAGNOSIS — R413 Other amnesia: Secondary | ICD-10-CM | POA: Diagnosis not present

## 2022-08-16 DIAGNOSIS — G454 Transient global amnesia: Secondary | ICD-10-CM | POA: Diagnosis not present

## 2022-08-16 DIAGNOSIS — I10 Essential (primary) hypertension: Secondary | ICD-10-CM | POA: Diagnosis not present

## 2022-08-16 DIAGNOSIS — Z681 Body mass index (BMI) 19 or less, adult: Secondary | ICD-10-CM | POA: Diagnosis not present

## 2022-08-16 DIAGNOSIS — I6529 Occlusion and stenosis of unspecified carotid artery: Secondary | ICD-10-CM | POA: Diagnosis not present

## 2022-08-16 DIAGNOSIS — G894 Chronic pain syndrome: Secondary | ICD-10-CM | POA: Diagnosis not present

## 2022-08-20 ENCOUNTER — Encounter: Payer: Self-pay | Admitting: Cardiology

## 2022-08-20 ENCOUNTER — Ambulatory Visit: Payer: Medicare HMO | Attending: Cardiology | Admitting: Cardiology

## 2022-08-20 VITALS — BP 122/70 | HR 72 | Ht 68.0 in | Wt 125.2 lb

## 2022-08-20 DIAGNOSIS — I6523 Occlusion and stenosis of bilateral carotid arteries: Secondary | ICD-10-CM

## 2022-08-20 DIAGNOSIS — R55 Syncope and collapse: Secondary | ICD-10-CM | POA: Diagnosis not present

## 2022-08-20 NOTE — Progress Notes (Signed)
Clinical Summary Jamie Lam is a 67 y.o.male seen today for follow up of the following medical problems.    1.Syncope 08/2022 echo: LVEF 60-65%, no WMAs, aortic sclerosis 07/2022 carotid US: mod plaque RICA, 50-69% stenosis LICA -orthostatic dizzienss, orthostatics negative last clinic visit - had been on muscle relaxer  - mild dizziness at times.   - reports 3 episodes over the last few years - most recent episode few months ago - was sitting on the couch. Got up and walked out of living room into hallway, started feeling dizzy. Next he knew had passed out in the hallway. LOC just briefly -- can have some orthostatic dizziness - 1 gallon of sweet tea a day.  - takes zanaflex 3 times day.   2. Hyperlipidemia - Jan 2023 TC 200 TG 138 HDL 55 LDL 122 - atorvastatin was recently increased to 80mg  daily  3. Carotid stenosis - 07/2022 carotid US: mod plaque RICA, 50-69% stenosis LICA  CAD risk factors: +smoker x 45 year, HL, mother MI early 73s.   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 10/2019 Echo: EF 60-65%, no rwma, nl RV fxn, triv AI.   Mixed hyperlipidemia    Tobacco abuse      Allergies  Allergen Reactions   Abilify [Aripiprazole] Other (See Comments)    "makes me feel crazy"   Azithromycin Hives   Varenicline Tartrate      Current Outpatient Medications  Medication Sig Dispense Refill   albuterol (PROVENTIL) 2 MG tablet Take 2 mg by mouth 4 (four) times daily.     albuterol (VENTOLIN HFA) 108 (90 Base) MCG/ACT inhaler Inhale 1-2 puffs into the lungs every 6 (six) hours as needed for wheezing or shortness of breath.      aspirin EC 81 MG tablet Take 81 mg by mouth at bedtime. Swallow whole.     atorvastatin (LIPITOR) 40 MG tablet Take 40 mg by mouth every  Monday, Wednesday, and Friday.      B Complex-C (B-COMPLEX WITH VITAMIN C) tablet Take 3 tablets by mouth daily.      Cholecalciferol (VITAMIN D3 PO) Take 2 tablets by mouth daily.     gabapentin (NEURONTIN) 800 MG tablet Take 800 mg by mouth in the morning, at noon, in the evening, and at bedtime.      HYDROcodone-acetaminophen (NORCO) 10-325 MG per tablet Take 1 tablet by mouth every 4 (four) hours as needed (pain.).      levalbuterol (XOPENEX HFA) 45 MCG/ACT inhaler  (Patient not taking: Reported on 07/04/2022)     omeprazole (PRILOSEC) 40 MG capsule Take 40 mg by mouth daily before breakfast.      tiZANidine (ZANAFLEX) 4 MG tablet Take 4 mg by mouth in the morning, at noon, in the evening, and at bedtime.      traMADol (ULTRAM) 50 MG tablet Take 100 mg by mouth every 4 (four) hours as needed (pain.).  (Patient not taking: Reported on 07/04/2022)     traZODone (DESYREL) 100 MG tablet Take 100 mg by mouth at bedtime.      TRELEGY ELLIPTA 200-62.5-25 MCG/INH AEPB Inhale 1 puff into the lungs daily as needed (high pollen/high temperature (poor air quality)).  zolpidem (AMBIEN) 10 MG tablet Take 10 mg by mouth at bedtime.      No current facility-administered medications for this visit.     Past Surgical History:  Procedure Laterality Date   BACK SURGERY     COLONOSCOPY  2009   Dr. Darrick Penna: rare sigmoid diverticula, otherwise no polyps, masses, inflammatory changes, or AVMS.    ESOPHAGOGASTRODUODENOSCOPY  2007   non-erosive antral gastirtis, positive H.pylori serology, reportedly treated with Prevpac     Allergies  Allergen Reactions   Abilify [Aripiprazole] Other (See Comments)    "makes me feel crazy"   Azithromycin Hives   Varenicline Tartrate       Family History  Problem Relation Age of Onset   Heart failure Mother    Cancer Father    Colon cancer Neg Hx    Colon polyps Neg Hx      Social History Jamie Lam reports that he has been smoking cigarettes. He has been  smoking an average of 1.5 packs per day. He has never used smokeless tobacco. Jamie Lam reports no history of alcohol use.   Review of Systems CONSTITUTIONAL: No weight loss, fever, chills, weakness or fatigue.  HEENT: Eyes: No visual loss, blurred vision, double vision or yellow sclerae.No hearing loss, sneezing, congestion, runny nose or sore throat.  SKIN: No rash or itching.  CARDIOVASCULAR: per hpi RESPIRATORY: No shortness of breath, cough or sputum.  GASTROINTESTINAL: No anorexia, nausea, vomiting or diarrhea. No abdominal pain or blood.  GENITOURINARY: No burning on urination, no polyuria NEUROLOGICAL: No headache, dizziness, syncope, paralysis, ataxia, numbness or tingling in the extremities. No change in bowel or bladder control.  MUSCULOSKELETAL: No muscle, back pain, joint pain or stiffness.  LYMPHATICS: No enlarged nodes. No history of splenectomy.  PSYCHIATRIC: No history of depression or anxiety.  ENDOCRINOLOGIC: No reports of sweating, cold or heat intolerance. No polyuria or polydipsia.  Marland Kitchen   Physical Examination Today's Vitals   08/20/22 1031  BP: 122/70  Pulse: 72  SpO2: 97%  Weight: 125 lb 3.2 oz (56.8 kg)  Height:  (1.727 m)   Body mass index is 19.04 kg/m.  Gen: resting comfortably, no acute distress HEENT: no scleral icterus, pupils equal round and reactive, no palptable cervical adenopathy,  CV: RRR, 2/6 systolic murmur rusb, no JVD. +bilateral carotid bruits Resp: Clear to auscultation bilaterally GI: abdomen is soft, non-tender, non-distended, normal bowel sounds, no hepatosplenomegaly MSK: extremities are warm, no edema.  Skin: warm, no rash Neuro:  no focal deficits Psych: appropriate affect   Diagnostic Studies  06/2022 carotid US IMPRESSION: 1. Moderate amount of bilateral atherosclerotic plaque, not resulting in a hemodynamically significant stenosis within either internal carotid artery. 2. Antegrade flow demonstrated within right  vertebral artery, though note is made of a pre steal waveform, as could be seen in the setting a right subclavian artery steal syndrome. Correlation with acquisition of bilateral upper extremity blood pressures could be performed as indicated. If asymmetric, and patient reports posterior fossa symptoms with right upper extremity exercise, further evaluation with neck CTA could be performed as indicated.  08/2022 echo  IMPRESSIONS     1. Left ventricular ejection fraction, by estimation, is 60 to 65%. The  left ventricle has normal function. The left ventricle has no regional  wall motion abnormalities. Transverse false tendon noted - normal variant.  Left ventricular diastolic  parameters were normal.   2. Right ventricular systolic function is normal. The right ventricular  size is normal. Tricuspid  regurgitation signal is inadequate for assessing  PA pressure.   3. The mitral valve is grossly normal. Trivial mitral valve  regurgitation.   4. The aortic valve is tricuspid. There is mild calcification of the  aortic valve. Aortic valve regurgitation is trivial. Aortic valve  sclerosis/calcification is present, without any evidence of aortic  stenosis. Aortic valve mean gradient measures 6.0  mmHg.   5. The inferior vena cava is normal in size with greater than 50%  respiratory variability, suggesting right atrial pressure of 3 mmHg.    Assessment and Plan  1. Syncope - likely orthostatic syncope, encourated increased oral hydration - no need for futher workup at this time  2. Carotid stenosis - continue ASA, statin - will need repeat US next  3. Heart murmur - aortic sclerosis without stenosis on echo - no further workup indicated       Antoine Poche, M.D.

## 2022-08-20 NOTE — Patient Instructions (Signed)
Medication Instructions:  Your physician recommends that you continue on your current medications as directed. Please refer to the Current Medication list given to you today.  *If you need a refill on your cardiac medications before your next appointment, please call your pharmacy*   Lab Work: None If you have labs (blood work) drawn today and your tests are completely normal, you will receive your results only by: MyChart Message (if you have MyChart) OR A paper copy in the mail If you have any lab test that is abnormal or we need to change your treatment, we will call you to review the results.   Testing/Procedures: None   Follow-Up: At [facility], you and your health needs are our priority.  As part of our continuing mission to provide you with exceptional heart care, we have created designated Provider Care Teams.  These Care Teams include your primary Cardiologist (physician) and Advanced Practice Providers (APPs -  Physician Assistants and Nurse Practitioners) who all work together to provide you with the care you need, when you need it.  We recommend signing up for the patient portal called "MyChart".  Sign up information is provided on this After Visit Summary.  MyChart is used to connect with patients for Virtual Visits (Telemedicine).  Patients are able to view lab/test results, encounter notes, upcoming appointments, etc.  Non-urgent messages can be sent to your provider as well.   To learn more about what you can do with MyChart, go to [email or web address].    Your next appointment:   6 month(s)  Provider:   Jonathan Branch, MD    Other Instructions    

## 2022-08-21 ENCOUNTER — Encounter: Payer: Self-pay | Admitting: Physician Assistant

## 2022-09-16 DIAGNOSIS — I10 Essential (primary) hypertension: Secondary | ICD-10-CM | POA: Diagnosis not present

## 2022-09-16 DIAGNOSIS — M1991 Primary osteoarthritis, unspecified site: Secondary | ICD-10-CM | POA: Diagnosis not present

## 2022-09-16 DIAGNOSIS — J449 Chronic obstructive pulmonary disease, unspecified: Secondary | ICD-10-CM | POA: Diagnosis not present

## 2022-09-16 DIAGNOSIS — G894 Chronic pain syndrome: Secondary | ICD-10-CM | POA: Diagnosis not present

## 2022-09-17 ENCOUNTER — Encounter: Payer: Self-pay | Admitting: Physician Assistant

## 2022-09-17 ENCOUNTER — Ambulatory Visit: Payer: Medicare HMO

## 2022-09-17 ENCOUNTER — Ambulatory Visit: Payer: Medicare HMO | Admitting: Physician Assistant

## 2022-09-17 VITALS — BP 120/80 | HR 100 | Resp 18 | Ht 68.0 in | Wt 123.0 lb

## 2022-09-17 DIAGNOSIS — Z125 Encounter for screening for malignant neoplasm of prostate: Secondary | ICD-10-CM | POA: Diagnosis not present

## 2022-09-17 DIAGNOSIS — E559 Vitamin D deficiency, unspecified: Secondary | ICD-10-CM | POA: Diagnosis not present

## 2022-09-17 DIAGNOSIS — E7849 Other hyperlipidemia: Secondary | ICD-10-CM | POA: Diagnosis not present

## 2022-09-17 DIAGNOSIS — R413 Other amnesia: Secondary | ICD-10-CM

## 2022-09-17 DIAGNOSIS — D518 Other vitamin B12 deficiency anemias: Secondary | ICD-10-CM | POA: Diagnosis not present

## 2022-09-17 DIAGNOSIS — G9332 Myalgic encephalomyelitis/chronic fatigue syndrome: Secondary | ICD-10-CM | POA: Diagnosis not present

## 2022-09-17 DIAGNOSIS — E782 Mixed hyperlipidemia: Secondary | ICD-10-CM | POA: Diagnosis not present

## 2022-09-17 DIAGNOSIS — Z0001 Encounter for general adult medical examination with abnormal findings: Secondary | ICD-10-CM | POA: Diagnosis not present

## 2022-09-17 NOTE — Patient Instructions (Addendum)
It was a pleasure to see you today at our office.   Recommendations:   MRI of the brain, the radiology office will call you to arrange you appointment   Follow up in 1 month  Neurocognitive testing down the line  recommend to go over these medications with his physician.  Recommend discontinuing Ambien.   No more tobacco   For guidance regarding [facility] and if placement were needed at the facility, contact Sidney Ace, Social Worker tel: [phone]  For assessment of decision of mental capacity and competency:  Call Dr. Erick Blinks, geriatric psychiatrist at [phone] Counseling regarding caregiver distress, including caregiver depression, anxiety and issues regarding community resources, adult day care programs, adult living facilities, or memory care questions:  please contact your  Primary Doctor's Social Worker  Whom to call: Memory  decline, memory medications: Call our office [phone]  For psychiatric meds, mood meds: Please have your primary care physician manage these medications.  If you have any severe symptoms of a stroke, or other severe issues such as confusion,severe chills or fever, etc call 911 or go to the ER as you may need to be evaluated further    RECOMMENDATIONS FOR ALL PATIENTS WITH MEMORY PROBLEMS: 1. Continue to exercise (Recommend 30 minutes of walking everyday, or 3 hours every week) 2. Increase social interactions - continue going to [place] and enjoy social gatherings with friends and family 3. Eat healthy, avoid fried foods and eat more fruits and vegetables 4. Maintain adequate blood pressure, blood sugar, and blood cholesterol level. Reducing the risk of stroke and cardiovascular disease also helps promoting better memory. 5. Avoid stressful situations. Live a simple life and avoid aggravations. Organize your time and prepare for the next day in anticipation. 6. Sleep well, avoid any interruptions of sleep and avoid any  distractions in the bedroom that may interfere with adequate sleep quality 7. Avoid sugar, avoid sweets as there is a strong link between excessive sugar intake, diabetes, and cognitive impairment We discussed the Mediterranean diet, which has been shown to help patients reduce the risk of progressive memory disorders and reduces cardiovascular risk. This includes eating fish, eat fruits and green leafy vegetables, nuts like almonds and hazelnuts, walnuts, and also use olive oil. Avoid fast foods and fried foods as much as possible. Avoid sweets and sugar as sugar use has been linked to worsening of memory function.  There is always a concern of gradual progression of memory problems. If this is the case, then we may need to adjust level of care according to patient needs. Support, both to the patient and caregiver, should then be put into place.      You have been referred for a neuropsychological evaluation (i.e., evaluation of memory and thinking abilities). Please bring someone with you to this appointment if possible, as it is helpful for the doctor to hear from both you and another adult who knows you well. Please bring eyeglasses and hearing aids if you wear them.    The evaluation will take approximately 3 hours and has two parts:   The first part is a clinical interview with the neuropsychologist (Dr. Milbert Coulter or Dr. Roseanne Reno). During the interview, the neuropsychologist will speak with you and the individual you brought to the appointment.    The second part of the evaluation is testing with the doctor's technician Annabelle Harman or Selena Batten). During the testing, the technician will ask you to remember different types of material, solve problems, and answer  some questionnaires. Your family member will not be present for this portion of the evaluation.   Please note: We must reserve several hours of the neuropsychologist's time and the psychometrician's time for your evaluation appointment. As such, there is  a No-Show fee of $100. If you are unable to attend any of your appointments, please contact our office as soon as possible to reschedule.    FALL PRECAUTIONS: Be cautious when walking. Scan the area for obstacles that may increase the risk of trips and falls. When getting up in the mornings, sit up at the edge of the bed for a few minutes before getting out of bed. Consider elevating the bed at the head end to avoid drop of blood pressure when getting up. Walk always in a well-lit room (use night lights in the walls). Avoid area rugs or power cords from appliances in the middle of the walkways. Use a walker or a cane if necessary and consider physical therapy for balance exercise. Get your eyesight checked regularly.  FINANCIAL OVERSIGHT: Supervision, especially oversight when making financial decisions or transactions is also recommended.  HOME SAFETY: Consider the safety of the kitchen when operating appliances like stoves, microwave oven, and blender. Consider having supervision and share cooking responsibilities until no longer able to participate in those. Accidents with firearms and other hazards in the house should be identified and addressed as well.   ABILITY TO BE LEFT ALONE: If patient is unable to contact 911 operator, consider using LifeLine, or when the need is there, arrange for someone to stay with patients. Smoking is a fire hazard, consider supervision or cessation. Risk of wandering should be assessed by caregiver and if detected at any point, supervision and safe proof recommendations should be instituted.  MEDICATION SUPERVISION: Inability to self-administer medication needs to be constantly addressed. Implement a mechanism to ensure safe administration of the medications.   DRIVING: Regarding driving, in patients with progressive memory problems, driving will be impaired. We advise to have someone else do the driving if trouble finding directions or if minor accidents are  reported. Independent driving assessment is available to determine safety of driving.   If you are interested in the driving assessment, you can contact the following:  [employer] in [place] [phone]  [facility] [phone]  [facility] [phone] or [phone]    Mediterranean Diet A Mediterranean diet refers to food and lifestyle choices that are based on the traditions of countries located on the [employer]. This way of eating has been shown to help prevent certain conditions and improve outcomes for people who have chronic diseases, like kidney disease and heart disease. What are tips for following this plan? Lifestyle  Cook and eat meals together with your family, when possible. Drink enough fluid to keep your urine clear or pale yellow. Be physically active every day. This includes: Aerobic exercise like running or swimming. Leisure activities like gardening, walking, or housework. Get 7-8 hours of sleep each night. If recommended by your health care provider, drink red wine in moderation. This means 1 glass a day for nonpregnant women and 2 glasses a day for men. A glass of wine equals 5 oz (150 mL). Reading food labels  Check the serving size of packaged foods. For foods such as rice and pasta, the serving size refers to the amount of cooked product, not dry. Check the total fat in packaged foods. Avoid foods that have saturated fat or trans  fats. Check the ingredients list for added sugars, such as corn syrup. Shopping  At the grocery store, buy most of your food from the areas near the walls of the store. This includes: Fresh fruits and vegetables (produce). Grains, beans, nuts, and seeds. Some of these may be available in unpackaged forms or large amounts (in bulk). Fresh seafood. Poultry and eggs. Low-fat dairy products. Buy whole ingredients instead of prepackaged  foods. Buy fresh fruits and vegetables in-season from local farmers markets. Buy frozen fruits and vegetables in resealable bags. If you do not have access to quality fresh seafood, buy precooked frozen shrimp or canned fish, such as tuna, salmon, or sardines. Buy small amounts of raw or cooked vegetables, salads, or olives from the deli or salad bar at your store. Stock your pantry so you always have certain foods on hand, such as olive oil, canned tuna, canned tomatoes, rice, pasta, and beans. Cooking  Cook foods with extra-virgin olive oil instead of using butter or other vegetable oils. Have meat as a side dish, and have vegetables or grains as your main dish. This means having meat in small portions or adding small amounts of meat to foods like pasta or stew. Use beans or vegetables instead of meat in common dishes like chili or lasagna. Experiment with different cooking methods. Try roasting or broiling vegetables instead of steaming or sauteing them. Add frozen vegetables to soups, stews, pasta, or rice. Add nuts or seeds for added healthy fat at each meal. You can add these to yogurt, salads, or vegetable dishes. Marinate fish or vegetables using olive oil, lemon juice, garlic, and fresh herbs. Meal planning  Plan to eat 1 vegetarian meal one day each week. Try to work up to 2 vegetarian meals, if possible. Eat seafood 2 or more times a week. Have healthy snacks readily available, such as: Vegetable sticks with hummus. Greek yogurt. Fruit and nut trail mix. Eat balanced meals throughout the week. This includes: Fruit: 2-3 servings a day Vegetables: 4-5 servings a day Low-fat dairy: 2 servings a day Fish, poultry, or lean meat: 1 serving a day Beans and legumes: 2 or more servings a week Nuts and seeds: 1-2 servings a day Whole grains: 6-8 servings a day Extra-virgin olive oil: 3-4 servings a day Limit red meat and sweets to only a few servings a month What are my food  choices? Mediterranean diet Recommended Grains: Whole-grain pasta. Brown rice. Bulgar wheat. Polenta. Couscous. Whole-wheat bread. Orpah Cobb. Vegetables: Artichokes. Beets. Broccoli. Cabbage. Carrots. Eggplant. Green beans. Chard. Kale. Spinach. Onions. Leeks. Peas. Squash. Tomatoes. Peppers. Radishes. Fruits: Apples. Apricots. Avocado. Berries. Bananas. Cherries. Dates. Figs. Grapes. Lemons. Melon. Oranges. Peaches. Plums. Pomegranate. Meats and other protein foods: Beans. Almonds. Sunflower seeds. Pine nuts. Peanuts. Cod. Salmon. Scallops. Shrimp. Tuna. Tilapia. Clams. Oysters. Eggs. Dairy: Low-fat milk. Cheese. Greek yogurt. Beverages: Water. Red wine. Herbal tea. Fats and oils: Extra virgin olive oil. Avocado oil. Grape seed oil. Sweets and desserts: [place] yogurt with honey. Baked apples. Poached pears. Trail mix. Seasoning and other foods: Basil. Cilantro. Coriander. Cumin. Mint. Parsley. Sage. Rosemary. Tarragon. Garlic. Oregano. Thyme. Pepper. Balsalmic vinegar. Tahini. Hummus. Tomato sauce. Olives. Mushrooms. Limit these Grains: Prepackaged pasta or rice dishes. Prepackaged cereal with added sugar. Vegetables: Deep fried potatoes (french fries). Fruits: Fruit canned in syrup. Meats and other protein foods: Beef. Pork. Lamb. Poultry with skin. Hot dogs. Tomasa Blase. Dairy: Ice cream. Sour cream. Whole milk. Beverages: Juice. Sugar-sweetened soft drinks. Beer. Liquor and spirits. Fats and  oils: Butter. Canola oil. Vegetable oil. Beef fat (tallow). Lard. Sweets and desserts: Cookies. Cakes. Pies. Candy. Seasoning and other foods: Mayonnaise. Premade sauces and marinades. The items listed may not be a complete list. Talk with your dietitian about what dietary choices are right for you. Summary The Mediterranean diet includes both food and lifestyle choices. Eat a variety of fresh fruits and vegetables, beans, nuts, seeds, and whole grains. Limit the amount of red meat and sweets that  you eat. Talk with your health care provider about whether it is safe for you to drink red wine in moderation. This means 1 glass a day for nonpregnant women and 2 glasses a day for men. A glass of wine equals 5 oz (150 mL). This information is not intended to replace advice given to you by your health care provider. Make sure you discuss any questions you have with your health care provider. Document Released: 12/14/2015 Document Revised: 01/16/2016 Document Reviewed: 12/14/2015 Elsevier Interactive Patient Education  2017 [employer].
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
## 2022-10-21 ENCOUNTER — Ambulatory Visit: Payer: Medicare HMO | Admitting: Physician Assistant

## 2022-10-31 DIAGNOSIS — G894 Chronic pain syndrome: Secondary | ICD-10-CM | POA: Diagnosis not present

## 2022-10-31 DIAGNOSIS — R634 Abnormal weight loss: Secondary | ICD-10-CM | POA: Diagnosis not present

## 2022-10-31 DIAGNOSIS — I6529 Occlusion and stenosis of unspecified carotid artery: Secondary | ICD-10-CM | POA: Diagnosis not present

## 2022-10-31 DIAGNOSIS — J449 Chronic obstructive pulmonary disease, unspecified: Secondary | ICD-10-CM | POA: Diagnosis not present

## 2022-10-31 DIAGNOSIS — M1991 Primary osteoarthritis, unspecified site: Secondary | ICD-10-CM | POA: Diagnosis not present

## 2022-10-31 DIAGNOSIS — R413 Other amnesia: Secondary | ICD-10-CM | POA: Diagnosis not present

## 2022-10-31 DIAGNOSIS — Z681 Body mass index (BMI) 19 or less, adult: Secondary | ICD-10-CM | POA: Diagnosis not present

## 2022-10-31 DIAGNOSIS — E291 Testicular hypofunction: Secondary | ICD-10-CM | POA: Diagnosis not present

## 2022-10-31 DIAGNOSIS — I10 Essential (primary) hypertension: Secondary | ICD-10-CM | POA: Diagnosis not present

## 2022-10-31 DIAGNOSIS — G454 Transient global amnesia: Secondary | ICD-10-CM | POA: Diagnosis not present

## 2022-11-03 DIAGNOSIS — E782 Mixed hyperlipidemia: Secondary | ICD-10-CM | POA: Diagnosis not present

## 2022-11-03 DIAGNOSIS — I10 Essential (primary) hypertension: Secondary | ICD-10-CM | POA: Diagnosis not present

## 2022-11-03 DIAGNOSIS — J449 Chronic obstructive pulmonary disease, unspecified: Secondary | ICD-10-CM | POA: Diagnosis not present

## 2022-11-15 DIAGNOSIS — E291 Testicular hypofunction: Secondary | ICD-10-CM | POA: Diagnosis not present

## 2022-12-18 DIAGNOSIS — R7309 Other abnormal glucose: Secondary | ICD-10-CM | POA: Diagnosis not present

## 2023-01-21 DIAGNOSIS — M1991 Primary osteoarthritis, unspecified site: Secondary | ICD-10-CM | POA: Diagnosis not present

## 2023-01-21 DIAGNOSIS — J449 Chronic obstructive pulmonary disease, unspecified: Secondary | ICD-10-CM | POA: Diagnosis not present

## 2023-01-21 DIAGNOSIS — G894 Chronic pain syndrome: Secondary | ICD-10-CM | POA: Diagnosis not present

## 2023-01-21 DIAGNOSIS — I10 Essential (primary) hypertension: Secondary | ICD-10-CM | POA: Diagnosis not present

## 2023-02-19 DIAGNOSIS — M5134 Other intervertebral disc degeneration, thoracic region: Secondary | ICD-10-CM | POA: Diagnosis not present

## 2023-02-19 DIAGNOSIS — I6529 Occlusion and stenosis of unspecified carotid artery: Secondary | ICD-10-CM | POA: Diagnosis not present

## 2023-02-19 DIAGNOSIS — E291 Testicular hypofunction: Secondary | ICD-10-CM | POA: Diagnosis not present

## 2023-02-19 DIAGNOSIS — Z23 Encounter for immunization: Secondary | ICD-10-CM | POA: Diagnosis not present

## 2023-02-19 DIAGNOSIS — Z681 Body mass index (BMI) 19 or less, adult: Secondary | ICD-10-CM | POA: Diagnosis not present

## 2023-02-19 DIAGNOSIS — J449 Chronic obstructive pulmonary disease, unspecified: Secondary | ICD-10-CM | POA: Diagnosis not present

## 2023-02-19 DIAGNOSIS — G894 Chronic pain syndrome: Secondary | ICD-10-CM | POA: Diagnosis not present

## 2023-02-19 DIAGNOSIS — I10 Essential (primary) hypertension: Secondary | ICD-10-CM | POA: Diagnosis not present

## 2023-03-06 DIAGNOSIS — J449 Chronic obstructive pulmonary disease, unspecified: Secondary | ICD-10-CM | POA: Diagnosis not present

## 2023-03-06 DIAGNOSIS — I10 Essential (primary) hypertension: Secondary | ICD-10-CM | POA: Diagnosis not present

## 2023-03-18 DIAGNOSIS — M1991 Primary osteoarthritis, unspecified site: Secondary | ICD-10-CM | POA: Diagnosis not present

## 2023-03-18 DIAGNOSIS — J449 Chronic obstructive pulmonary disease, unspecified: Secondary | ICD-10-CM | POA: Diagnosis not present

## 2023-03-18 DIAGNOSIS — G894 Chronic pain syndrome: Secondary | ICD-10-CM | POA: Diagnosis not present

## 2023-03-18 DIAGNOSIS — M5134 Other intervertebral disc degeneration, thoracic region: Secondary | ICD-10-CM | POA: Diagnosis not present

## 2023-03-18 DIAGNOSIS — I10 Essential (primary) hypertension: Secondary | ICD-10-CM | POA: Diagnosis not present

## 2023-03-26 ENCOUNTER — Other Ambulatory Visit: Payer: Self-pay

## 2023-03-26 ENCOUNTER — Emergency Department (HOSPITAL_COMMUNITY)
Admission: EM | Admit: 2023-03-26 | Discharge: 2023-03-26 | Disposition: A | Discharge status: Home / Self Care | Payer: Medicare HMO | Attending: Emergency Medicine | Admitting: Emergency Medicine

## 2023-03-26 ENCOUNTER — Emergency Department (HOSPITAL_COMMUNITY): Payer: Medicare HMO

## 2023-03-26 ENCOUNTER — Encounter (HOSPITAL_COMMUNITY): Payer: Self-pay

## 2023-03-26 DIAGNOSIS — I700 Atherosclerosis of aorta: Secondary | ICD-10-CM | POA: Diagnosis not present

## 2023-03-26 DIAGNOSIS — M5136 Other intervertebral disc degeneration, lumbar region with discogenic back pain only: Secondary | ICD-10-CM | POA: Diagnosis not present

## 2023-03-26 DIAGNOSIS — J449 Chronic obstructive pulmonary disease, unspecified: Secondary | ICD-10-CM | POA: Diagnosis not present

## 2023-03-26 DIAGNOSIS — M545 Low back pain, unspecified: Secondary | ICD-10-CM | POA: Diagnosis present

## 2023-03-26 DIAGNOSIS — F17200 Nicotine dependence, unspecified, uncomplicated: Secondary | ICD-10-CM | POA: Diagnosis not present

## 2023-03-26 DIAGNOSIS — M5416 Radiculopathy, lumbar region: Secondary | ICD-10-CM | POA: Insufficient documentation

## 2023-03-26 DIAGNOSIS — M47816 Spondylosis without myelopathy or radiculopathy, lumbar region: Secondary | ICD-10-CM | POA: Diagnosis not present

## 2023-03-26 DIAGNOSIS — J45909 Unspecified asthma, uncomplicated: Secondary | ICD-10-CM | POA: Insufficient documentation

## 2023-03-26 DIAGNOSIS — M5137 Other intervertebral disc degeneration, lumbosacral region with discogenic back pain only: Secondary | ICD-10-CM | POA: Diagnosis not present

## 2023-03-26 DIAGNOSIS — Z7982 Long term (current) use of aspirin: Secondary | ICD-10-CM | POA: Insufficient documentation

## 2023-03-26 MED ORDER — LIDOCAINE 5 % EX PTCH
1.0000 | MEDICATED_PATCH | CUTANEOUS | Status: DC
  Administered 2023-03-26: 1 via TRANSDERMAL
  Filled 2023-03-26: qty 1

## 2023-03-26 MED ORDER — PREDNISONE 10 MG (21) PO TBPK: ORAL_TABLET | Freq: Every day | ORAL | 0 refills | Status: DC

## 2023-03-26 MED ORDER — METHYLPREDNISOLONE SODIUM SUCC 125 MG IJ SOLR
125.0000 mg | Freq: Once | INTRAMUSCULAR | Status: AC
  Administered 2023-03-26: 125 mg via INTRAMUSCULAR
  Filled 2023-03-26: qty 2

## 2023-03-26 MED ORDER — OXYCODONE HCL 5 MG PO TABS
5.0000 mg | ORAL_TABLET | ORAL | Status: DC | PRN
  Administered 2023-03-26: 5 mg via ORAL
  Filled 2023-03-26: qty 1

## 2023-03-26 NOTE — ED Notes (Signed)
Pt returned from CT °

## 2023-03-26 NOTE — ED Notes (Signed)
Patient transported to CT 

## 2023-03-26 NOTE — ED Provider Notes (Signed)
[place] EMERGENCY DEPARTMENT AT [facility] Provider Note   CSN: [id number] Arrival date & time: 03/26/23  1759     History  Chief Complaint  Patient presents with   Back Pain    Jamie Lam is a 67 y.o. male.  67 year old male with complaint of low back pain, radiating down the left leg to his foot. Onset about 5 days ago when he turned and felt a pop in his back and radiating down his left leg. No abdominal pain, no loss or bowel or bladder control, no saddle paresthesia. Patient has been lying on his back on the sofa for the past 5 days, taking his hydrocodone without any improvement in his back pain.        Home Medications Prior to Admission medications   Medication Sig Start Date End Date Taking? Authorizing Provider  predniSONE (STERAPRED UNI-PAK 21 TAB) 10 MG (21) TBPK tablet Take by mouth daily. Take 6 tabs by mouth daily  for 2 days, then 5 tabs for 2 days, then 4 tabs for 2 days, then 3 tabs for 2 days, 2 tabs for 2 days, then 1 tab by mouth daily for 2 days 03/26/23  Yes Army Melia A, PA-C  albuterol (PROVENTIL) 2 MG tablet Take 2 mg by mouth 4 (four) times daily. 06/14/13   [provider]  albuterol (VENTOLIN HFA) 108 (90 Base) MCG/ACT inhaler Inhale 1-2 puffs into the lungs every 6 (six) hours as needed for wheezing or shortness of breath.  10/29/19   [provider]  aspirin EC 81 MG tablet Take 81 mg by mouth at bedtime. Swallow whole.    [provider]  atorvastatin (LIPITOR) 80 MG tablet Take 80 mg by mouth daily. 08/19/22   [provider]  B Complex-C (B-COMPLEX WITH VITAMIN C) tablet Take 3 tablets by mouth daily.     [provider]  Cholecalciferol (VITAMIN D3 PO) Take 2 tablets by mouth daily.    [provider]  gabapentin (NEURONTIN) 800 MG tablet Take 800 mg by mouth in the morning, at noon, in the evening, and at bedtime.  06/20/13   [provider]  HYDROcodone-acetaminophen  (NORCO) 10-325 MG per tablet Take 1 tablet by mouth every 4 (four) hours as needed (pain.).  06/26/13   [provider]  levalbuterol [name] HFA) 45 MCG/ACT inhaler  07/03/20   [provider]  omeprazole (PRILOSEC) 40 MG capsule Take 40 mg by mouth daily before breakfast.  05/29/13   [provider]  tiZANidine (ZANAFLEX) 4 MG tablet Take 4 mg by mouth in the morning, at noon, in the evening, and at bedtime.  10/14/19   [provider]  traMADol (ULTRAM) 50 MG tablet Take 100 mg by mouth every 4 (four) hours as needed (pain.). 04/27/13   [provider]  traZODone (DESYREL) 100 MG tablet Take 100 mg by mouth at bedtime.  10/29/19   [provider]  TRELEGY ELLIPTA 200-62.5-25 MCG/INH AEPB Inhale 1 puff into the lungs daily as needed (high pollen/high temperature (poor air quality)).  10/05/19   [provider]  zolpidem (AMBIEN) 10 MG tablet Take 10 mg by mouth at bedtime.  09/01/19   [provider]      Allergies    Abilify [aripiprazole], Azithromycin, and Varenicline tartrate    Review of Systems   Review of Systems Negative except as per HPI Physical Exam Updated Vital Signs BP 133/87   Pulse 74  Temp 98.4 F (36.9 C) (Oral)   Resp 17   Ht 5\' 8"  (1.727 m)   Wt 54.4 kg   SpO2 94%   BMI 18.25 kg/m  Physical Exam Vitals and nursing note reviewed.  Constitutional:      General: He is not in acute distress.    Appearance: He is well-developed. He is not diaphoretic.  HENT:     Head: Normocephalic and atraumatic.  Cardiovascular:     Pulses: Normal pulses.  Pulmonary:     Effort: Pulmonary effort is normal.  Abdominal:     Palpations: Abdomen is soft.     Tenderness: There is no abdominal tenderness.  Musculoskeletal:        General: Tenderness present. No swelling.     Lumbar back: No bony tenderness. Positive right straight leg raise test and positive left straight leg raise test.       Back:     Right  lower leg: No edema.     Left lower leg: No edema.  Skin:    General: Skin is warm and dry.     Findings: No erythema or rash.  Neurological:     Mental Status: He is alert and oriented to person, place, and time.  Psychiatric:        Behavior: Behavior normal.     ED Results / Procedures / Treatments   Labs (all labs ordered are listed, but only abnormal results are displayed) Labs Reviewed - No data to display  EKG None  Radiology CT Lumbar Spine Wo Contrast  Result Date: 03/26/2023 CLINICAL DATA:  Low back pain, injury 5 days ago EXAM: CT LUMBAR SPINE WITHOUT CONTRAST TECHNIQUE: Multidetector CT imaging of the lumbar spine was performed without intravenous contrast administration. Multiplanar CT image reconstructions were also generated. RADIATION DOSE REDUCTION: This exam was performed according to the departmental dose-optimization program which includes automated exposure control, adjustment of the mA and/or kV according to patient size and/or use of iterative reconstruction technique. COMPARISON:  06/01/2021 FINDINGS: Segmentation: 5 lumbar type vertebrae. Alignment: Mild right convex scoliosis centered at the L2-3 level. Otherwise alignment is anatomic. Vertebrae: No acute or destructive bony abnormalities. Paraspinal and other soft tissues: Paraspinal soft tissues are unremarkable. Bilateral nonobstructing renal calculi are partially visualized. Atherosclerosis of the aorta and its distal branches. Disc levels: Findings at individual levels are as follows: L1-2: Circumferential disc bulge with bilateral facet and ligamentum flavum hypertrophy results in bilateral neural foraminal encroachment and moderate central canal stenosis. L2-3: Circumferential disc bulge with bilateral facet and ligamentum flavum hypertrophy result in moderate central canal stenosis and symmetrical neural foraminal encroachment. L3-4: Broad-based disc bulge with bilateral facet hypertrophy. Symmetrical  bilateral neural foraminal encroachment. L4-5: Circumferential disc bulge and bilateral facet hypertrophy result in moderate central canal stenosis and bilateral neural foraminal encroachment. L5-S1: Circumferential disc bulge and bilateral facet hypertrophy result in symmetrical neural foraminal encroachment. Reconstructed images demonstrate no additional findings. IMPRESSION: 1. No acute lumbar spine fracture. 2. Diffuse lumbar spondylosis and facet hypertrophy, without significant change since prior MRI. 3. Bilateral renal calculi. 4.  Aortic Atherosclerosis (ICD10-I70.0). Electronically Signed   By: Sharlet Salina M.D.   On: 03/26/2023 22:58    Procedures Procedures    Medications Ordered in ED Medications  oxyCODONE (Oxy IR/ROXICODONE) immediate release tablet 5 mg (5 mg Oral Given 03/26/23 2102)  lidocaine (LIDODERM) 5 % 1 patch (1 patch Transdermal Patch Applied 03/26/23 2102)  methylPREDNISolone sodium succinate (SOLU-MEDROL) 125 mg/2 mL injection 125 mg (has  no administration in time range)    ED Course/ Medical Decision Making/ A&P                                 Medical Decision Making Amount and/or Complexity of Data Reviewed Radiology: ordered.  Risk Prescription drug management.   This patient presents to the ED for concern of left lower back pain, this involves an extensive number of treatment options, and is a complaint that carries with it a high risk of complications and morbidity.  The differential diagnosis includes herniated disc, sciatica, compression fracture   Co morbidities that complicate the patient evaluation  History of prior lumbar fusion, GERD, chronic back pain, tobacco use, hyperlipidemia, COPD, asthma   Additional history obtained:  Additional history obtained from family at bedside who contributes to history as above   Imaging Studies ordered:  I ordered imaging studies including CT lumbar   I independently visualized and interpreted imaging  which showed no acute findings  I agree with the radiologist interpretation   Problem List / ED Course / Critical interventions / Medication management  67 year old male presents the emergency room with complaint of left lower back pain which radiates down his left leg after twisting his back and feeling a pop.  No red flags.  CT lumbar spine with spondylosis, discussed results with patient.  Plan is to follow-up with his provider.  He is already prescribed hydrocodone and gabapentin.  Will provide IM Solu-Medrol here and discharged with prednisone taper.  Provided with basic lumbar rehab exercises to do at home. I ordered medication including oxycodone, lidoderm  for back pain  Reevaluation of the patient after these medicines showed that the patient improved I have reviewed the patients home medicines and have made adjustments as needed   Social Determinants of Health:  Has PCP   Test / Admission - Considered:  Stable for dc         Final Clinical Impression(s) / ED Diagnoses Final diagnoses:  Lumbar radiculopathy    Rx / DC Orders ED Discharge Orders          Ordered    predniSONE (STERAPRED UNI-PAK 21 TAB) 10 MG (21) TBPK tablet  Daily        03/26/23 2303              Jeannie Fend, PA-C 03/26/23 2308    Bethann Berkshire, MD 03/28/23 1125

## 2023-03-26 NOTE — ED Notes (Signed)
Patient seen ambulating in the lobby

## 2023-03-26 NOTE — Discharge Instructions (Addendum)
Take Prednisone as prescribed. Apply over the counter lidocaine patch as directed.  Warm compresses to low back for 20 minutes, follow with gentle stretching.   Continue with Norco and Gabapentin as prescribed.

## 2023-03-26 NOTE — ED Triage Notes (Signed)
Pt to er, pt states that about 5 days ago he was trying to look back when he was in the car and felt a pop, states that he has had back pain ever since, states that it is making it hard for him to walk, pt states that the pain shoots down his leg.  Denies any loss of bowel or bladder function, states that he has some numbness in his toes on both feet, but states that has been going on for months and months.

## 2023-04-24 DIAGNOSIS — M5134 Other intervertebral disc degeneration, thoracic region: Secondary | ICD-10-CM | POA: Diagnosis not present

## 2023-04-24 DIAGNOSIS — G894 Chronic pain syndrome: Secondary | ICD-10-CM | POA: Diagnosis not present

## 2023-04-24 DIAGNOSIS — I10 Essential (primary) hypertension: Secondary | ICD-10-CM | POA: Diagnosis not present

## 2023-04-24 DIAGNOSIS — I6529 Occlusion and stenosis of unspecified carotid artery: Secondary | ICD-10-CM | POA: Diagnosis not present

## 2023-04-24 DIAGNOSIS — E291 Testicular hypofunction: Secondary | ICD-10-CM | POA: Diagnosis not present

## 2023-04-24 DIAGNOSIS — J449 Chronic obstructive pulmonary disease, unspecified: Secondary | ICD-10-CM | POA: Diagnosis not present

## 2023-04-24 DIAGNOSIS — M1991 Primary osteoarthritis, unspecified site: Secondary | ICD-10-CM | POA: Diagnosis not present

## 2023-04-24 DIAGNOSIS — F5101 Primary insomnia: Secondary | ICD-10-CM | POA: Diagnosis not present

## 2023-05-22 ENCOUNTER — Ambulatory Visit: Payer: Medicare HMO | Attending: Cardiology | Admitting: Cardiology

## 2023-05-22 ENCOUNTER — Encounter: Payer: Self-pay | Admitting: Cardiology

## 2023-05-22 NOTE — Progress Notes (Deleted)
Clinical Summary Jamie Lam is a 68 y.o.male  seen today for follow up of the following medical problems.      1.Syncope 08/2022 echo: LVEF 60-65%, no WMAs, aortic sclerosis 07/2022 carotid US: mod plaque RICA, 50-69% stenosis LICA -orthostatic dizzienss, orthostatics negative last clinic visit - had been on muscle relaxer   - mild dizziness at times.    - reports 3 episodes over the last few years - most recent episode few months ago - was sitting on the couch. Got up and walked out of living room into hallway, started feeling dizzy. Next he knew had passed out in the hallway. LOC just briefly -- can have some orthostatic dizziness - 1 gallon of sweet tea a day.  - takes zanaflex 3 times day.    2. Hyperlipidemia - Jan 2023 TC 200 TG 138 HDL 55 LDL 122 - atorvastatin was recently increased to 80mg  daily   3. Carotid stenosis - 07/2022 carotid US: mod plaque RICA, 50-69% stenosis LICA   CAD risk factors: +smoker x 45 year, HL, mother MI early 89s.    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 10/2019 Echo: EF 60-65%, no rwma, nl RV fxn, triv AI.   Mixed hyperlipidemia    Tobacco abuse      Allergies  Allergen Reactions   Abilify [Aripiprazole] Other (See Comments)    "makes me feel crazy"   Azithromycin Hives   Varenicline Tartrate      Current Outpatient Medications  Medication Sig Dispense Refill   albuterol (PROVENTIL) 2 MG tablet Take 2 mg by mouth 4 (four) times daily.     albuterol (VENTOLIN HFA) 108 (90 Base) MCG/ACT inhaler Inhale 1-2 puffs into the lungs every 6 (six) hours as needed for wheezing or shortness of breath.      aspirin EC 81 MG tablet Take 81 mg by mouth at bedtime. Swallow whole.     atorvastatin (LIPITOR) 80 MG tablet Take 80 mg by mouth  daily.     B Complex-C (B-COMPLEX WITH VITAMIN C) tablet Take 3 tablets by mouth daily.      Cholecalciferol (VITAMIN D3 PO) Take 2 tablets by mouth daily.     gabapentin (NEURONTIN) 800 MG tablet Take 800 mg by mouth in the morning, at noon, in the evening, and at bedtime.      HYDROcodone-acetaminophen (NORCO) 10-325 MG per tablet Take 1 tablet by mouth every 4 (four) hours as needed (pain.).      levalbuterol (XOPENEX HFA) 45 MCG/ACT inhaler      omeprazole (PRILOSEC) 40 MG capsule Take 40 mg by mouth daily before breakfast.      predniSONE (STERAPRED UNI-PAK 21 TAB) 10 MG (21) TBPK tablet Take by mouth daily. Take 6 tabs by mouth daily  for 2 days, then 5 tabs for 2 days, then 4 tabs for 2 days, then 3 tabs for 2 days, 2 tabs for 2 days, then 1 tab by mouth daily for 2 days 42 tablet 0   tiZANidine (ZANAFLEX) 4 MG tablet Take 4 mg by mouth in the morning, at noon, in the evening, and at bedtime.      traMADol (ULTRAM) 50 MG tablet Take 100 mg by mouth every  4 (four) hours as needed (pain.).     traZODone (DESYREL) 100 MG tablet Take 100 mg by mouth at bedtime.      TRELEGY ELLIPTA 200-62.5-25 MCG/INH AEPB Inhale 1 puff into the lungs daily as needed (high pollen/high temperature (poor air quality)).      zolpidem (AMBIEN) 10 MG tablet Take 10 mg by mouth at bedtime.      No current facility-administered medications for this visit.     Past Surgical History:  Procedure Laterality Date   BACK SURGERY     COLONOSCOPY  2009   Dr. Darrick Penna: rare sigmoid diverticula, otherwise no polyps, masses, inflammatory changes, or AVMS.    ESOPHAGOGASTRODUODENOSCOPY  2007   non-erosive antral gastirtis, positive H.pylori serology, reportedly treated with Prevpac     Allergies  Allergen Reactions   Abilify [Aripiprazole] Other (See Comments)    "makes me feel crazy"   Azithromycin Hives   Varenicline Tartrate       Family History  Problem Relation Age of Onset   Heart failure Mother     Cancer Father    Colon cancer Neg Hx    Colon polyps Neg Hx      Social History Mr. Risenhoover reports that he has been smoking cigarettes. He has never used smokeless tobacco. Mr. Steuerwald reports no history of alcohol use.   Review of Systems CONSTITUTIONAL: No weight loss, fever, chills, weakness or fatigue.  HEENT: Eyes: No visual loss, blurred vision, double vision or yellow sclerae.No hearing loss, sneezing, congestion, runny nose or sore throat.  SKIN: No rash or itching.  CARDIOVASCULAR:  RESPIRATORY: No shortness of breath, cough or sputum.  GASTROINTESTINAL: No anorexia, nausea, vomiting or diarrhea. No abdominal pain or blood.  GENITOURINARY: No burning on urination, no polyuria NEUROLOGICAL: No headache, dizziness, syncope, paralysis, ataxia, numbness or tingling in the extremities. No change in bowel or bladder control.  MUSCULOSKELETAL: No muscle, back pain, joint pain or stiffness.  LYMPHATICS: No enlarged nodes. No history of splenectomy.  PSYCHIATRIC: No history of depression or anxiety.  ENDOCRINOLOGIC: No reports of sweating, cold or heat intolerance. No polyuria or polydipsia.  Marland Kitchen   Physical Examination There were no vitals filed for this visit. There were no vitals filed for this visit.  Gen: resting comfortably, no acute distress HEENT: no scleral icterus, pupils equal round and reactive, no palptable cervical adenopathy,  CV Resp: Clear to auscultation bilaterally GI: abdomen is soft, non-tender, non-distended, normal bowel sounds, no hepatosplenomegaly MSK: extremities are warm, no edema.  Skin: warm, no rash Neuro:  no focal deficits Psych: appropriate affect   Diagnostic Studies  06/2022 carotid US IMPRESSION: 1. Moderate amount of bilateral atherosclerotic plaque, not resulting in a hemodynamically significant stenosis within either internal carotid artery. 2. Antegrade flow demonstrated within right vertebral artery, though note is made of a pre  steal waveform, as could be seen in the setting a right subclavian artery steal syndrome. Correlation with acquisition of bilateral upper extremity blood pressures could be performed as indicated. If asymmetric, and patient reports posterior fossa symptoms with right upper extremity exercise, further evaluation with neck CTA could be performed as indicated.   08/2022 echo   IMPRESSIONS     1. Left ventricular ejection fraction, by estimation, is 60 to 65%. The  left ventricle has normal function. The left ventricle has no regional  wall motion abnormalities. Transverse false tendon noted - normal variant.  Left ventricular diastolic  parameters were normal.   2. Right ventricular systolic  function is normal. The right ventricular  size is normal. Tricuspid regurgitation signal is inadequate for assessing  PA pressure.   3. The mitral valve is grossly normal. Trivial mitral valve  regurgitation.   4. The aortic valve is tricuspid. There is mild calcification of the  aortic valve. Aortic valve regurgitation is trivial. Aortic valve  sclerosis/calcification is present, without any evidence of aortic  stenosis. Aortic valve mean gradient measures 6.0  mmHg.   5. The inferior vena cava is normal in size with greater than 50%  respiratory variability, suggesting right atrial pressure of 3 mmHg.    Assessment and Plan   1. Syncope - likely orthostatic syncope, encouraged increased oral hydration - no need for futher workup at this time   2. Carotid stenosis - continue ASA, statin - will need repeat US next   3. Heart murmur - aortic sclerosis without stenosis on echo - no further workup indicated     Antoine Poche, M.D., F.A.C.C.

## 2023-06-11 ENCOUNTER — Ambulatory Visit: Payer: Medicare HMO | Admitting: Psychology

## 2023-06-11 ENCOUNTER — Ambulatory Visit: Payer: Medicare HMO

## 2023-06-11 ENCOUNTER — Encounter: Payer: Self-pay | Admitting: Psychology

## 2023-06-11 DIAGNOSIS — G3184 Mild cognitive impairment, so stated: Secondary | ICD-10-CM | POA: Diagnosis not present

## 2023-06-11 DIAGNOSIS — Z720 Tobacco use: Secondary | ICD-10-CM | POA: Insufficient documentation

## 2023-06-11 DIAGNOSIS — J45909 Unspecified asthma, uncomplicated: Secondary | ICD-10-CM | POA: Insufficient documentation

## 2023-06-11 DIAGNOSIS — R4189 Other symptoms and signs involving cognitive functions and awareness: Secondary | ICD-10-CM

## 2023-06-11 DIAGNOSIS — J449 Chronic obstructive pulmonary disease, unspecified: Secondary | ICD-10-CM | POA: Insufficient documentation

## 2023-06-11 DIAGNOSIS — K219 Gastro-esophageal reflux disease without esophagitis: Secondary | ICD-10-CM | POA: Insufficient documentation

## 2023-06-11 DIAGNOSIS — G8929 Other chronic pain: Secondary | ICD-10-CM | POA: Insufficient documentation

## 2023-06-11 DIAGNOSIS — E782 Mixed hyperlipidemia: Secondary | ICD-10-CM | POA: Insufficient documentation

## 2023-06-11 HISTORY — DX: Mild cognitive impairment of uncertain or unknown etiology: G31.84

## 2023-06-11 NOTE — Progress Notes (Signed)
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 completed a clinical interview with Dr. Maryland, billed as one unit [id number], and 115 minutes of cognitive testing and scoring, billed as one unit [phone] and three additional units [id number]. Psychometrist Luke Pitcher, B.S. assisted Dr. Maryland with test administration and scoring procedures. As a separate and discrete service, one unit [id number] and two units [phone] were billed for Dr. Loralee time spent in interpretation and report writing.

## 2023-06-11 NOTE — Progress Notes (Signed)
   Psychometrician Note   Cognitive testing was administered to Montgomery CHRISTELLA Pepper by Luke Pitcher, B.S. (psychometrist) under the supervision of Dr. Zachary C. Merz, Ph.D., ABPP, licensed psychologist on 06/11/2023. Mr. Appleyard did not appear overtly distressed by the testing session per behavioral observation or responses across self-report questionnaires. Rest breaks were offered.    The battery of tests administered was selected by Dr. Arthea KYM Maryland, Ph.D., ABPP with consideration to Mr. Bonanno current level of functioning, the nature of his symptoms, emotional and behavioral responses during interview, level of literacy, observed level of motivation/effort, and the nature of the referral question. This battery was communicated to the psychometrist. Communication between Dr. Arthea KYM Maryland, Ph.D., ABPP and the psychometrist was ongoing throughout the evaluation and Dr. Arthea KYM Maryland, Ph.D., ABPP was immediately accessible at all times. Dr. Zachary C. Merz, Ph.D., ABPP provided supervision to the psychometrist on the date of this service to the extent necessary to assure the quality of all services provided.    BRAVLIO LUCA will return within approximately 1-2 weeks for an interactive feedback session with Dr. Maryland at which time his test performances, clinical impressions, and treatment recommendations will be reviewed in detail. Mr. Welshans understands he can contact our office should he require our assistance before this time.  A total of 115 minutes of billable time were spent face-to-face with Mr. Baer by the psychometrist. This includes both test administration and scoring time. Billing for these services is reflected in the clinical report generated by Dr. Arthea KYM Maryland, Ph.D., ABPP  This note reflects time spent with the psychometrician and does not include test scores or any clinical interpretations made by Dr. Maryland. The full report will follow in a separate note.

## 2023-06-18 ENCOUNTER — Ambulatory Visit: Payer: Medicare HMO | Admitting: Psychology

## 2023-06-18 DIAGNOSIS — G3184 Mild cognitive impairment, so stated: Secondary | ICD-10-CM

## 2023-06-18 NOTE — Progress Notes (Addendum)
   Neuropsychology Feedback Session Jamie Lam. [facility] [place] Department of Neurology  Reason for Referral:   Jamie Lam is a 68 y.o. right-handed Caucasian male referred by Jamie Kays, PA-C, to characterize his current cognitive functioning and assist with diagnostic clarity and treatment planning in the context of subjective cognitive decline.   Feedback:   Jamie Lam completed a comprehensive neuropsychological evaluation on 06/11/2023. Please refer to that encounter for the full report and recommendations. Briefly, results suggested isolated impairments across semantic fluency and encoding (i.e., learning) aspects of a list-based memory task. However, performances across story and figure-based memory tasks were consistently appropriate. Further performance variability was exhibited across executive functioning. He also had some difficulty drawing a clock (he drew three clock hands) whereas all other visuospatial tasks were appropriate. Performance was otherwise appropriate relative to age-matched peers across other assessed domains. The etiology for ongoing dysfunction remains uncertain. His impairment across semantic fluency is unexpected and, as there is no neuroimaging available, I cannot offer a good explanation for this isolated weakness at the present time. Variability across other cognitive domains such as executive functioning and learning and memory is likely multifactorial in nature. Medically, he reported diffuse physical pain, ongoing sleep disturbances, and constant daily headache experiences. Medication wise, he reported active prescriptions surrounding hydrocodone, gabapentin, and Ambien (he denied current Tramadol use and reported prednisone use as being very infrequent). All of these medications, including the three he reported actively taking, have well established cognitive side effects that can create and/or maintain cognitive dysfunction. At the present time,  it may be that the combination of these factors best explains current testing patterns and day-to-day concerns. Neurologically speaking, while executive and language dysfunction could raise concern for frontotemporal lobar degeneration, there is no neuroimaging to review to better inform this. He and his daughter denied any personality changes or behavior concerns, lessening the likelihood of this presentation. Despite some variability, memory performances do not suggest symptomatic Alzheimer's disease in a compelling fashion.  Jamie Lam was accompanied by his wife during the current telephone call. They were within their residence while I was within my office. I discussed the limitations of evaluation and management by telemedicine and the availability of in person appointments. Jamie Lam expressed his understanding and agreed to proceed. Content of the current session focused on the results of his neuropsychological evaluation. Jamie Lam was given the opportunity to ask questions and his questions were answered. He was encouraged to reach out should additional questions arise. A copy of his report was mailed at the conclusion of the visit.      One unit [id number] (31 minutes) was billed for Dr. Tammy Sours time spent preparing for, conducting, and documenting the current feedback session with Jamie Lam.

## 2023-06-19 ENCOUNTER — Encounter: Payer: Self-pay | Admitting: Physician Assistant

## 2023-06-26 ENCOUNTER — Ambulatory Visit: Payer: Medicare HMO | Admitting: Physician Assistant

## 2023-07-04 ENCOUNTER — Ambulatory Visit
Admission: RE | Admit: 2023-07-04 | Discharge: 2023-07-04 | Disposition: A | Discharge status: Home / Self Care | Payer: Medicare HMO | Source: Ambulatory Visit | Attending: Physician Assistant | Admitting: Physician Assistant

## 2023-07-04 DIAGNOSIS — R413 Other amnesia: Secondary | ICD-10-CM | POA: Diagnosis not present

## 2023-07-09 ENCOUNTER — Other Ambulatory Visit: Payer: Self-pay

## 2023-07-09 DIAGNOSIS — I6523 Occlusion and stenosis of bilateral carotid arteries: Secondary | ICD-10-CM

## 2023-07-17 ENCOUNTER — Ambulatory Visit (HOSPITAL_COMMUNITY)

## 2023-07-17 DIAGNOSIS — Z681 Body mass index (BMI) 19 or less, adult: Secondary | ICD-10-CM | POA: Diagnosis not present

## 2023-07-17 DIAGNOSIS — I10 Essential (primary) hypertension: Secondary | ICD-10-CM | POA: Diagnosis not present

## 2023-07-17 DIAGNOSIS — M1991 Primary osteoarthritis, unspecified site: Secondary | ICD-10-CM | POA: Diagnosis not present

## 2023-07-17 DIAGNOSIS — G894 Chronic pain syndrome: Secondary | ICD-10-CM | POA: Diagnosis not present

## 2023-07-17 DIAGNOSIS — F5101 Primary insomnia: Secondary | ICD-10-CM | POA: Diagnosis not present

## 2023-07-17 DIAGNOSIS — J449 Chronic obstructive pulmonary disease, unspecified: Secondary | ICD-10-CM | POA: Diagnosis not present

## 2023-07-17 DIAGNOSIS — E291 Testicular hypofunction: Secondary | ICD-10-CM | POA: Diagnosis not present

## 2023-07-17 DIAGNOSIS — Z0001 Encounter for general adult medical examination with abnormal findings: Secondary | ICD-10-CM | POA: Diagnosis not present

## 2023-07-17 DIAGNOSIS — M5134 Other intervertebral disc degeneration, thoracic region: Secondary | ICD-10-CM | POA: Diagnosis not present

## 2023-07-21 ENCOUNTER — Ambulatory Visit: Payer: Medicare HMO | Admitting: Physician Assistant

## 2023-07-21 DIAGNOSIS — G3184 Mild cognitive impairment, so stated: Secondary | ICD-10-CM

## 2023-07-21 NOTE — Progress Notes (Addendum)
 Assessment/Plan:   Mild cognitive impairment of uncertain or unknown etiology   Jamie Lam is a very pleasant 68 y.o. RH male with a history off hypertension, hyperlipidemia, tobacco abuse, arthritis, chronic pain on multiple agents, insomnia, COPD, carotid artery stenosis diagnosis of mild cognitive impairment, without major suspicion for neurodegenerative disease presenting today in follow-up for evaluation of memory concerns.  Given the recent neuropsych diagnosis, no antidementia medication is indicated.  Discussed once again the role of polypharmacy on memory, recommend that he discuss this with his PCP or prescribing physicians, he has reduced the dosage on his own but he could benefit from tighter control with pain clinic.    Recommendations:   Follow up in  6 months. Repeat neuropsych evaluation in 18-24 months for diagnostic clarity No indication for antidementia medication at this time Recommend to discuss with PCP sleep issues Recommend discontinuing Ambien, muscle relaxers, pain medications as this may resent the role on his memory difficulties. Recommend pain clinic evaluation.  Recommend sleep studies for possible sleep apnea  Recommend good control of cardiovascular risk factors Tobacco cessation counseled Continue to control mood as per PCP     Subjective:   This patient is here alone . Previous records as well as any outside records available were reviewed prior to todays visit.   Patient was last seen on 09/17/2022    Any changes in memory since last visit? "A little better since cutting on gabapentin,muscle relaxer, I cut down on my breathing pills".  He continues to have some difficulty with conversations and names of people, long-term memory is good. Has not been doing brain stimulating exercises but tries to stay busy.  repeats oneself?  Endorsed   Disoriented when walking into a room?  Patient denies    Misplacing objects?  Patient denies.   Wandering  behavior?   Denies.   Any personality changes since last visit?  At times he is more irritable, worries less about things . Any worsening depression?: "a little, my daughter is in  toxic relationship"    Hallucinations or paranoia?) denies  Seizures?   denies    Any sleep changes? Does not sleep very well.  Reports vivid dreams especially about work, denies REM behavior or sleepwalking   Sleep apnea? Denies   Any hygiene concerns?   denies   Independent of bathing and dressing?  Endorsed  Does the patient needs help with medications? Patient is in charge  Who is in charge of the finances?  Wife has always been in charge  Any changes in appetite?  "Some days better than others "   Patient have trouble swallowing?  denies   Does the patient cook?  Denies  Any kitchen accidents such as leaving the stove on? Denies  Any headaches?    denies   Vision changes? denies Chronic pain?  Endorsed, he has a history of lumbar radiculopathy pain, taking multiple pills of pain control medications  Ambulates with difficulty?  He has chronic hip pain, which limits his mobility   Recent falls or head injuries?    denies      Unilateral weakness, numbness or tingling?   denies   Any tremors?  denies   Any anosmia?    denies   Any incontinence of urine?  denies   Any bowel dysfunction?  denies      Patient lives with daughter and his wife.  Does the patient drive?  Endorsed, for the last year, he reports forgetting where he  goes so he had to go back home   Continues to smoke 1/2 pack/day.  Neuropsych evaluation 06/11/23  Briefly, results suggested isolated impairments across semantic fluency and encoding (i.e., learning) aspects of a list-based memory task. However, performances across story and figure-based memory tasks were consistently appropriate. Further performance variability was exhibited across executive functioning. He also had some difficulty drawing a clock (he drew three clock hands) whereas all  other visuospatial tasks were appropriate. Performance was otherwise appropriate relative to age-matched peers across other assessed domains. The etiology for ongoing dysfunction remains uncertain. His impairment across semantic fluency is unexpected and, as there is no neuroimaging available, I cannot offer a good explanation for this isolated weakness at the present time. Variability across other cognitive domains such as executive functioning and learning and memory is likely multifactorial in nature. Medically, he reported diffuse physical pain, ongoing sleep disturbances, and constant daily headache experiences. Medication wise, he reported active prescriptions surrounding hydrocodone, gabapentin, and Ambien (he denied current Tramadol use and reported prednisone use as being very infrequent). All of these medications, including the three he reported actively taking, have well established cognitive side effects that can create and/or maintain cognitive dysfunction. At the present time, it may be that the combination of these factors best explains current testing patterns and day-to-day concerns. Neurologically speaking, while executive and language dysfunction could raise concern for frontotemporal lobar degeneration, there is no neuroimaging to review to better inform this. He and his daughter denied any personality changes or behavior concerns, lessening the likelihood of this presentation. Despite some variability, memory performances do not suggest symptomatic Alzheimer's disease in a compelling fashion.    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 10/2019 Echo: EF 60-65%, no rwma, nl RV fxn, triv AI.   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 Darrick Penna: rare sigmoid diverticula, otherwise no polyps, masses, inflammatory changes, or AVMS.    ESOPHAGOGASTRODUODENOSCOPY  2007   non-erosive antral gastirtis, positive H.pylori serology, reportedly treated with Prevpac     PREVIOUS MEDICATIONS:   CURRENT MEDICATIONS:  Outpatient Encounter Medications as of 07/21/2023  Medication Sig   albuterol (PROVENTIL) 2 MG tablet Take 2 mg by mouth 4 (four) times daily.   albuterol (VENTOLIN HFA) 108 (90 Base) MCG/ACT inhaler Inhale 1-2 puffs into the lungs every 6 (six) hours as needed for wheezing or shortness of breath.    aspirin EC 81 MG tablet Take 81 mg by mouth at bedtime. Swallow whole.   atorvastatin (LIPITOR) 80 MG tablet Take 80 mg by mouth daily.   B Complex-C (B-COMPLEX WITH VITAMIN C) tablet Take 3 tablets by mouth daily.    Cholecalciferol (VITAMIN D3 PO) Take 2 tablets by mouth daily.  gabapentin (NEURONTIN) 800 MG tablet Take 800 mg by mouth in the morning, at noon, in the evening, and at bedtime.    HYDROcodone-acetaminophen (NORCO) 10-325 MG per tablet Take 1 tablet by mouth every 4 (four) hours as needed (pain.).    levalbuterol (XOPENEX HFA) 45 MCG/ACT inhaler    omeprazole (PRILOSEC) 40 MG capsule Take 40 mg by mouth daily before breakfast.    tiZANidine (ZANAFLEX) 4 MG tablet Take 4 mg by mouth in the morning, at noon, in the evening, and at bedtime.    traMADol (ULTRAM) 50 MG tablet Take 100 mg by mouth every 4 (four) hours as needed (pain.).   TRELEGY ELLIPTA  200-62.5-25 MCG/INH AEPB Inhale 1 puff into the lungs daily as needed (high pollen/high temperature (poor air quality)).    zolpidem (AMBIEN) 10 MG tablet Take 10 mg by mouth at bedtime.    [DISCONTINUED] predniSONE (STERAPRED UNI-PAK 21 TAB) 10 MG (21) TBPK tablet Take by mouth daily. Take 6 tabs by mouth daily  for 2 days, then 5 tabs for 2 days, then 4 tabs for 2 days, then 3 tabs for 2 days, 2 tabs for 2 days, then 1 tab by mouth daily for 2 days   [DISCONTINUED] traZODone (DESYREL) 100 MG tablet Take 100 mg by mouth at bedtime.  (Patient not taking: Reported on 07/21/2023)   No facility-administered encounter medications on file as of 07/21/2023.     Objective:     PHYSICAL EXAMINATION:    VITALS:  There were no vitals filed for this visit.  GEN:  The patient appears stated age and is in NAD. HEENT:  Normocephalic, atraumatic.   Neurological examination:  General: NAD, well-groomed, appears stated age. Orientation: The patient is alert. Oriented to person, place and date Cranial nerves: There is good facial symmetry.The speech is fluent and clear. No aphasia or dysarthria. Fund of knowledge is appropriate. Recent memory impaired and remote memory is normal.  Attention and concentration are normal.  Able to name objects and repeat phrases.  Hearing is intact to conversational tone .  Sensation: Sensation is intact to light touch throughout Motor: Strength is at least antigravity x4. DTR's 2/4 in UE/LE      09/17/2022   11:00 AM  Montreal Cognitive Assessment   Visuospatial/ Executive (0/5) 5  Naming (0/3) 3  Attention: Read list of digits (0/2) 2  Attention: Read list of letters (0/1) 1  Attention: Serial 7 subtraction starting at 100 (0/3) 3  Language: Repeat phrase (0/2) 1  Language : Fluency (0/1) 1  Abstraction (0/2) 2  Delayed Recall (0/5) 5  Orientation (0/6) 6  Total 29  Adjusted Score (based on education) 30        No data to display             Movement  examination: Tone: There is normal tone in the UE/LE Abnormal movements:  no tremor.  No myoclonus.  No asterixis.   Coordination:  There is no decremation with RAM's. Normal finger to nose  Gait and Station: The patient has no difficulty arising out of a deep-seated chair without the use of the hands. The patient's stride length is good.  Gait is cautious and narrow.   Thank you for allowing [place] the opportunity to participate in the care of this nice patient. Please do not hesitate to contact us for any questions or concerns.   Total time spent on today's visit was 30 minutes dedicated to this patient today, preparing to  see patient, examining the patient, ordering tests and/or medications and counseling the patient, documenting clinical information in the EHR or other health record, independently interpreting results and communicating results to the patient/family, discussing treatment and goals, answering patient's questions and coordinating care.  Cc:  Elfredia Nevins, MD  Marlowe Kays 07/21/2023 3:57 PM

## 2023-07-21 NOTE — Patient Instructions (Signed)
 It was a pleasure to see you today at our office.   Recommendations:    recommend to go over the medications with his physician.   Recommend discontinuing Ambien.   No more tobacco  Discuss with your doctor the evaluation for sleep disorder Recommend psychotherapy for situational anxiety and depression  Follow up in 1 year   For guidance regarding [facility] and if placement were needed at the facility, contact Sidney Ace, Social Worker tel: [phone]  For assessment of decision of mental capacity and competency:  Call Dr. Erick Blinks, geriatric psychiatrist at [phone] Counseling regarding caregiver distress, including caregiver depression, anxiety and issues regarding community resources, adult day care programs, adult living facilities, or memory care questions:  please contact your  Primary Doctor's Social Worker  Whom to call: Memory  decline, memory medications: Call our office [phone]  For psychiatric meds, mood meds: Please have your primary care physician manage these medications.  If you have any severe symptoms of a stroke, or other severe issues such as confusion,severe chills or fever, etc call 911 or go to the ER as you may need to be evaluated further    RECOMMENDATIONS FOR ALL PATIENTS WITH MEMORY PROBLEMS: 1. Continue to exercise (Recommend 30 minutes of walking everyday, or 3 hours every week) 2. Increase social interactions - continue going to [place] and enjoy social gatherings with friends and family 3. Eat healthy, avoid fried foods and eat more fruits and vegetables 4. Maintain adequate blood pressure, blood sugar, and blood cholesterol level. Reducing the risk of stroke and cardiovascular disease also helps promoting better memory. 5. Avoid stressful situations. Live a simple life and avoid aggravations. Organize your time and prepare for the next day in anticipation. 6. Sleep well, avoid any interruptions of sleep and avoid  any distractions in the bedroom that may interfere with adequate sleep quality 7. Avoid sugar, avoid sweets as there is a strong link between excessive sugar intake, diabetes, and cognitive impairment We discussed the Mediterranean diet, which has been shown to help patients reduce the risk of progressive memory disorders and reduces cardiovascular risk. This includes eating fish, eat fruits and green leafy vegetables, nuts like almonds and hazelnuts, walnuts, and also use olive oil. Avoid fast foods and fried foods as much as possible. Avoid sweets and sugar as sugar use has been linked to worsening of memory function.  There is always a concern of gradual progression of memory problems. If this is the case, then we may need to adjust level of care according to patient needs. Support, both to the patient and caregiver, should then be put into place.       FALL PRECAUTIONS: Be cautious when walking. Scan the area for obstacles that may increase the risk of trips and falls. When getting up in the mornings, sit up at the edge of the bed for a few minutes before getting out of bed. Consider elevating the bed at the head end to avoid drop of blood pressure when getting up. Walk always in a well-lit room (use night lights in the walls). Avoid area rugs or power cords from appliances in the middle of the walkways. Use a walker or a cane if necessary and consider physical therapy for balance exercise. Get your eyesight checked regularly.  FINANCIAL OVERSIGHT: Supervision, especially oversight when making financial decisions or transactions is also recommended.  HOME SAFETY: Consider the safety of the kitchen when operating appliances like stoves, microwave oven, and blender. Consider  having supervision and share cooking responsibilities until no longer able to participate in those. Accidents with firearms and other hazards in the house should be identified and addressed as well.   ABILITY TO BE LEFT ALONE:  If patient is unable to contact 911 operator, consider using LifeLine, or when the need is there, arrange for someone to stay with patients. Smoking is a fire hazard, consider supervision or cessation. Risk of wandering should be assessed by caregiver and if detected at any point, supervision and safe proof recommendations should be instituted.  MEDICATION SUPERVISION: Inability to self-administer medication needs to be constantly addressed. Implement a mechanism to ensure safe administration of the medications.   DRIVING: Regarding driving, in patients with progressive memory problems, driving will be impaired. We advise to have someone else do the driving if trouble finding directions or if minor accidents are reported. Independent driving assessment is available to determine safety of driving.   If you are interested in the driving assessment, you can contact the following:  [employer] in [place] [phone]  [facility] [phone]  [facility] [phone] or [phone]    Mediterranean Diet A Mediterranean diet refers to food and lifestyle choices that are based on the traditions of countries located on the [employer]. This way of eating has been shown to help prevent certain conditions and improve outcomes for people who have chronic diseases, like kidney disease and heart disease. What are tips for following this plan? Lifestyle  Cook and eat meals together with your family, when possible. Drink enough fluid to keep your urine clear or pale yellow. Be physically active every day. This includes: Aerobic exercise like running or swimming. Leisure activities like gardening, walking, or housework. Get 7-8 hours of sleep each night. If recommended by your health care provider, drink red wine in moderation. This means 1 glass a day for nonpregnant women and 2 glasses a day for men. A glass of wine  equals 5 oz (150 mL). Reading food labels  Check the serving size of packaged foods. For foods such as rice and pasta, the serving size refers to the amount of cooked product, not dry. Check the total fat in packaged foods. Avoid foods that have saturated fat or trans fats. Check the ingredients list for added sugars, such as corn syrup. Shopping  At the grocery store, buy most of your food from the areas near the walls of the store. This includes: Fresh fruits and vegetables (produce). Grains, beans, nuts, and seeds. Some of these may be available in unpackaged forms or large amounts (in bulk). Fresh seafood. Poultry and eggs. Low-fat dairy products. Buy whole ingredients instead of prepackaged foods. Buy fresh fruits and vegetables in-season from local farmers markets. Buy frozen fruits and vegetables in resealable bags. If you do not have access to quality fresh seafood, buy precooked frozen shrimp or canned fish, such as tuna, salmon, or sardines. Buy small amounts of raw or cooked vegetables, salads, or olives from the deli or salad bar at your store. Stock your pantry so you always have certain foods on hand, such as olive oil, canned tuna, canned tomatoes, rice, pasta, and beans. Cooking  Cook foods with extra-virgin olive oil instead of using butter or other vegetable oils. Have meat as a side dish, and have vegetables or grains as your main dish. This means having meat in small portions or adding small amounts of meat to foods like pasta or stew. Use  beans or vegetables instead of meat in common dishes like chili or lasagna. Experiment with different cooking methods. Try roasting or broiling vegetables instead of steaming or sauteing them. Add frozen vegetables to soups, stews, pasta, or rice. Add nuts or seeds for added healthy fat at each meal. You can add these to yogurt, salads, or vegetable dishes. Marinate fish or vegetables using olive oil, lemon juice, garlic, and fresh  herbs. Meal planning  Plan to eat 1 vegetarian meal one day each week. Try to work up to 2 vegetarian meals, if possible. Eat seafood 2 or more times a week. Have healthy snacks readily available, such as: Vegetable sticks with hummus. Greek yogurt. Fruit and nut trail mix. Eat balanced meals throughout the week. This includes: Fruit: 2-3 servings a day Vegetables: 4-5 servings a day Low-fat dairy: 2 servings a day Fish, poultry, or lean meat: 1 serving a day Beans and legumes: 2 or more servings a week Nuts and seeds: 1-2 servings a day Whole grains: 6-8 servings a day Extra-virgin olive oil: 3-4 servings a day Limit red meat and sweets to only a few servings a month What are my food choices? Mediterranean diet Recommended Grains: Whole-grain pasta. Brown rice. Bulgar wheat. Polenta. Couscous. Whole-wheat bread. Orpah Cobb. Vegetables: Artichokes. Beets. Broccoli. Cabbage. Carrots. Eggplant. Green beans. Chard. Kale. Spinach. Onions. Leeks. Peas. Squash. Tomatoes. Peppers. Radishes. Fruits: Apples. Apricots. Avocado. Berries. Bananas. Cherries. Dates. Figs. Grapes. Lemons. Melon. Oranges. Peaches. Plums. Pomegranate. Meats and other protein foods: Beans. Almonds. Sunflower seeds. Pine nuts. Peanuts. Cod. Salmon. Scallops. Shrimp. Tuna. Tilapia. Clams. Oysters. Eggs. Dairy: Low-fat milk. Cheese. Greek yogurt. Beverages: Water. Red wine. Herbal tea. Fats and oils: Extra virgin olive oil. Avocado oil. Grape seed oil. Sweets and desserts: [place] yogurt with honey. Baked apples. Poached pears. Trail mix. Seasoning and other foods: Basil. Cilantro. Coriander. Cumin. Mint. Parsley. Sage. Rosemary. Tarragon. Garlic. Oregano. Thyme. Pepper. Balsalmic vinegar. Tahini. Hummus. Tomato sauce. Olives. Mushrooms. Limit these Grains: Prepackaged pasta or rice dishes. Prepackaged cereal with added sugar. Vegetables: Deep fried potatoes (french fries). Fruits: Fruit canned in syrup. Meats and  other protein foods: Beef. Pork. Lamb. Poultry with skin. Hot dogs. Tomasa Blase. Dairy: Ice cream. Sour cream. Whole milk. Beverages: Juice. Sugar-sweetened soft drinks. Beer. Liquor and spirits. Fats and oils: Butter. Canola oil. Vegetable oil. Beef fat (tallow). Lard. Sweets and desserts: Cookies. Cakes. Pies. Candy. Seasoning and other foods: Mayonnaise. Premade sauces and marinades. The items listed may not be a complete list. Talk with your dietitian about what dietary choices are right for you. Summary The Mediterranean diet includes both food and lifestyle choices. Eat a variety of fresh fruits and vegetables, beans, nuts, seeds, and whole grains. Limit the amount of red meat and sweets that you eat. Talk with your health care provider about whether it is safe for you to drink red wine in moderation. This means 1 glass a day for nonpregnant women and 2 glasses a day for men. A glass of wine equals 5 oz (150 mL). This information is not intended to replace advice given to you by your health care provider. Make sure you discuss any questions you have with your health care provider. Document Released: 12/14/2015 Document Revised: 01/16/2016 Document Reviewed: 12/14/2015 Elsevier Interactive Patient Education  2017 [employer].

## 2023-07-24 ENCOUNTER — Encounter: Payer: Self-pay | Admitting: *Deleted

## 2023-08-12 NOTE — Progress Notes (Unsigned)
 Cardiology Office Note:  .   Date:  08/13/2023  ID:  Jamie Lam, DOB 03/20/1956, MRN [id number] PCP: Elfredia Nevins, MD  [facility] Providers Cardiologist:  Dina Rich, MD { History of Present Illness: .   Jamie Lam is a 68 y.o. male with pmhx of Mild Aortic stenosis (08/2022 ECHO), Carotid Stenosis (Carotid US 07/2022: 50-69% bilateral stenosis), HLD, tobacco use, Fhx of CAD, COPD presents for 6 m/o follow up on Syncope.   Last seen by Dr. Wyline Mood 08/20/2022. Per chart review, reported syncopal episode a few months prior to visit and 3 episodes over the last few years. Suspected orthostatic dizziness, however previous negative orthostatics vitals.   During interview, patient denied any syncopal episodes, chest pain, palpitations, or exertional symptoms. Report SOB occasionally when outside but relates to pollen and COPD. Denies any orthopnea, PND, or edema. He did not get carotid US study due to financial concerns related to cost. Been taking Lipitor 80 mg x3 per week due to history of myalgia of daily dose. Currently smoke 1.5-2 ppd.   Studies Reviewed: Marland Kitchen    EKG Interpretation Date/Time:  Wednesday August 13 2023 13:31:00 EDT Ventricular Rate:  84 PR Interval:  138 QRS Duration:  90 QT Interval:  366 QTC Calculation: 432 R Axis:   71  Text Interpretation: Normal sinus rhythm Right atrial enlargement When compared with ECG of 24-Nov-2021 14:35, No significant change was found Confirmed by Renie Ora ([id number]) on 08/13/2023 1:35:16 PM   ECHO IMPRESSIONS  08/2022  1. Left ventricular ejection fraction, by estimation, is 60 to 65%. The  left ventricle has normal function. The left ventricle has no regional  wall motion abnormalities. Transverse false tendon noted - normal variant.  Left ventricular diastolic  parameters were normal.   2. Right ventricular systolic function is normal. The right ventricular  size is normal. Tricuspid regurgitation signal is  inadequate for assessing  PA pressure.   3. The mitral valve is grossly normal. Trivial mitral valve  regurgitation.   4. The aortic valve is tricuspid. There is mild calcification of the  aortic valve. Aortic valve regurgitation is trivial. Aortic valve  sclerosis/calcification is present, without any evidence of aortic  stenosis. Aortic valve mean gradient measures 6.0  mmHg.   5. The inferior vena cava is normal in size with greater than 50%  respiratory variability, suggesting right atrial pressure of 3 mmHg.   Comparison(s): Prior images reviewed side by side. LVEF remains normal  range at 60-65%.   Carotid US 07/2022 IMPRESSION: 1. Moderate calcified plaque again seen in the distal common carotid arteries bilaterally extending into the proximal internal carotid arteries. 2. Findings indicative of 50-69% bilateral internal carotid artery stenosis, likely closer to 50% rather than 69%. The velocities have significantly decreased bilaterally since prior examination.   Physical Exam:   VS:  BP 124/70   Pulse 84   Ht 5\' 8"  (1.727 m)   Wt 122 lb 9.6 oz (55.6 kg)   SpO2 93%   BMI 18.64 kg/m    Wt Readings from Last 3 Encounters:  08/13/23 122 lb 9.6 oz (55.6 kg)  03/26/23 120 lb (54.4 kg)  09/17/22 123 lb (55.8 kg)    GEN: Sitting in chair in no acute distress. Daughter present NECK: No JVD; No carotid bruits CARDIAC: RRR, no murmurs, rubs, gallops RESPIRATORY: wheezing throughout, no rales or rhonchi ABDOMEN: Soft, non-tender, non-distended EXTREMITIES:  No edema; No deformity   ASSESSMENT AND PLAN: .  Syncope  - denied any recent syncopal episodes.  - EKG today: NSR, HR 84  - Orthostatic vitals today:  122/70, HR 67 Sitting --> 124/72, HR 64 Standing  - Less likely cardiac cause with no exertional syncope and no arrhythmia on EKG. Can consider monitor in the future if indicated.  - Counseled on adequate hydration and slow position changes.  HLD  - per [facility]  DXA 09/2022: LDL 138, goal < 70 - currently taking Atorvastatin 80 mg x3 per week. D/C atorvastatin. Ordered Crestor 20 mg. Will titrate up as tolerated. If LDL not achieved with max dosage, can consider Zetia.  - Ordered Lipid panel and LFT's to be complete in 2-3 months  Mild Aortic Stenosis  - 08/2022 ECHO: LVEF 60-65%. Trivial MVR. Mild Aortic Stenosis. Trivial AVR - Monitor clinically; repeat ECHO in 08/2024 unless symptomatic  Carotid Stenosis  - 07/2022 carotid US: 50-69% bilateral  stenosis - Encouraged aggressive risk factor modification (lipid control, tobacco cessation, diet) -  Unable to get Carotid US at this time due to cost and financial concerns.     Tobacco Use - Currently 1.5 - 2  ppd. Previously tried to quit before with Chantix but unsuccessful. Not interested in cessation now. Willing to consider nicotine patch in the future.      Dispo: 4 months   Signed, Basilio Cairo, PA-C

## 2023-08-13 ENCOUNTER — Encounter: Payer: Self-pay | Admitting: Physician Assistant

## 2023-08-13 ENCOUNTER — Ambulatory Visit: Payer: Medicare HMO | Attending: Student | Admitting: Physician Assistant

## 2023-08-13 VITALS — BP 124/70 | HR 84 | Ht 68.0 in | Wt 122.6 lb

## 2023-08-13 DIAGNOSIS — R55 Syncope and collapse: Secondary | ICD-10-CM

## 2023-08-13 DIAGNOSIS — I6523 Occlusion and stenosis of bilateral carotid arteries: Secondary | ICD-10-CM

## 2023-08-13 DIAGNOSIS — F17200 Nicotine dependence, unspecified, uncomplicated: Secondary | ICD-10-CM | POA: Diagnosis not present

## 2023-08-13 DIAGNOSIS — E782 Mixed hyperlipidemia: Secondary | ICD-10-CM | POA: Diagnosis not present

## 2023-08-13 DIAGNOSIS — I350 Nonrheumatic aortic (valve) stenosis: Secondary | ICD-10-CM | POA: Diagnosis not present

## 2023-08-13 MED ORDER — ROSUVASTATIN CALCIUM 20 MG PO TABS: 20.0000 mg | ORAL_TABLET | Freq: Every day | ORAL | 3 refills | Status: DC

## 2023-08-13 NOTE — Patient Instructions (Signed)
 Medication Instructions:   Stop taking Lipitor ( Atorvastatin )  Start Taking Crestor 20 mg Daily   *If you need a refill on your cardiac medications before your next appointment, please call your pharmacy*  Lab Work: Your physician recommends that you return for lab work in: 2-3 Months Fasting ( Liver function Test, Lipid )   If you have labs (blood work) drawn today and your tests are completely normal, you will receive your results only by: MyChart Message (if you have MyChart) OR A paper copy in the mail If you have any lab test that is abnormal or we need to change your treatment, we will call you to review the results.  Testing/Procedures: NONE    Follow-Up: At [facility], you and your health needs are our priority.  As part of our continuing mission to provide you with exceptional heart care, our providers are all part of one team.  This team includes your primary Cardiologist (physician) and Advanced Practice Providers or APPs (Physician Assistants and Nurse Practitioners) who all work together to provide you with the care you need, when you need it.  Your next appointment:   4 month(s)  Provider:   You may see Dina Rich, MD or one of the following Advanced Practice Providers on your designated Care Team:   Randall An, PA-C  Scotesia Jarales, [place] Jacolyn Reedy, [place]     We recommend signing up for the patient portal called "MyChart".  Sign up information is provided on this After Visit Summary.  MyChart is used to connect with patients for Virtual Visits (Telemedicine).  Patients are able to view lab/test results, encounter notes, upcoming appointments, etc.  Non-urgent messages can be sent to your provider as well.   To learn more about what you can do with MyChart, go to [email or web address].   Other Instructions Thank you for choosing [facility]!

## 2023-08-18 DIAGNOSIS — M1991 Primary osteoarthritis, unspecified site: Secondary | ICD-10-CM | POA: Diagnosis not present

## 2023-08-18 DIAGNOSIS — M5134 Other intervertebral disc degeneration, thoracic region: Secondary | ICD-10-CM | POA: Diagnosis not present

## 2023-08-18 DIAGNOSIS — I10 Essential (primary) hypertension: Secondary | ICD-10-CM | POA: Diagnosis not present

## 2023-08-18 DIAGNOSIS — J449 Chronic obstructive pulmonary disease, unspecified: Secondary | ICD-10-CM | POA: Diagnosis not present

## 2023-08-18 DIAGNOSIS — G894 Chronic pain syndrome: Secondary | ICD-10-CM | POA: Diagnosis not present

## 2023-09-11 ENCOUNTER — Telehealth: Payer: Self-pay | Admitting: *Deleted

## 2023-09-11 NOTE — Telephone Encounter (Signed)
  Procedure: COLONSOCOPY  Height: 5'8 Weight: 128LBS        Have you had a colonoscopy before?  2009  Do you have family history of colon cancer?  no  Do you have a family history of polyps? no  Previous colonoscopy with polyps removed? no  Do you have a history colorectal cancer?   no  Are you diabetic?  no  Do you have a prosthetic or mechanical heart valve? no  Do you have a pacemaker/defibrillator?   no  Have you had endocarditis/atrial fibrillation?  no  Do you use supplemental oxygen/CPAP?  no  Have you had joint replacement within the last 12 months?  no  Do you tend to be constipated or have to use laxatives?  no   Do you have history of alcohol use? If yes, how much and how often.  no  Do you have history or are you using drugs? If yes, what do are you  using?  no  Have you ever had a stroke/heart attack?  no  Have you ever had a heart or other vascular stent placed,?no  Do you take weight loss medication? no  Do you take any blood-thinning medications such as: (Plavix, aspirin, Coumadin, Aggrenox, Brilinta, Xarelto, Eliquis, Pradaxa, Savaysa or Effient)? Aspirin 81mg   If yes we need the name, milligram, dosage and who is prescribing doctor:               Current Outpatient Medications  Medication Sig Dispense Refill   tiZANidine (ZANAFLEX) 4 MG tablet Take 4 mg by mouth in the morning, at noon, in the evening, and at bedtime.      traZODone (DESYREL) 150 MG tablet Take 150 mg by mouth at bedtime.     TRELEGY ELLIPTA 200-62.5-25 MCG/INH AEPB Inhale 1 puff into the lungs daily as needed (high pollen/high temperature (poor air quality)).      zolpidem (AMBIEN) 10 MG tablet Take 10 mg by mouth at bedtime.      albuterol (PROVENTIL) 2 MG tablet Take 2 mg by mouth 4 (four) times daily.     albuterol (VENTOLIN HFA) 108 (90 Base) MCG/ACT inhaler Inhale 1-2 puffs into the lungs every 6 (six) hours as needed for wheezing or shortness of breath.      aspirin EC 81 MG  tablet Take 81 mg by mouth at bedtime. Swallow whole.     B Complex-C (B-COMPLEX WITH VITAMIN C) tablet Take 3 tablets by mouth daily.      Cholecalciferol (VITAMIN D3 PO) Take 2 tablets by mouth daily.     gabapentin (NEURONTIN) 800 MG tablet Take 800 mg by mouth in the morning, at noon, in the evening, and at bedtime.      HYDROcodone-acetaminophen (NORCO) 10-325 MG per tablet Take 1 tablet by mouth every 4 (four) hours as needed (pain.).      levalbuterol (XOPENEX HFA) 45 MCG/ACT inhaler      omeprazole (PRILOSEC) 40 MG capsule Take 40 mg by mouth daily before breakfast.      rosuvastatin  (CRESTOR ) 20 MG tablet Take 1 tablet (20 mg total) by mouth daily. 90 tablet 3   No current facility-administered medications for this visit.    Allergies  Allergen Reactions   Abilify [Aripiprazole] Other (See Comments)    "makes me feel crazy"   Azithromycin Hives   Varenicline Tartrate

## 2023-09-26 NOTE — Telephone Encounter (Signed)
 ASA 3 - ok room 1/2

## 2023-10-01 NOTE — Telephone Encounter (Signed)
 LMTRC

## 2023-10-02 DIAGNOSIS — M1991 Primary osteoarthritis, unspecified site: Secondary | ICD-10-CM | POA: Diagnosis not present

## 2023-10-02 DIAGNOSIS — I10 Essential (primary) hypertension: Secondary | ICD-10-CM | POA: Diagnosis not present

## 2023-10-02 DIAGNOSIS — J449 Chronic obstructive pulmonary disease, unspecified: Secondary | ICD-10-CM | POA: Diagnosis not present

## 2023-10-02 DIAGNOSIS — E291 Testicular hypofunction: Secondary | ICD-10-CM | POA: Diagnosis not present

## 2023-10-02 DIAGNOSIS — G894 Chronic pain syndrome: Secondary | ICD-10-CM | POA: Diagnosis not present

## 2023-10-02 DIAGNOSIS — M5134 Other intervertebral disc degeneration, thoracic region: Secondary | ICD-10-CM | POA: Diagnosis not present

## 2023-10-16 ENCOUNTER — Encounter: Payer: Self-pay | Admitting: *Deleted

## 2023-10-16 ENCOUNTER — Other Ambulatory Visit: Payer: Self-pay | Admitting: *Deleted

## 2023-10-16 MED ORDER — PEG 3350-KCL-NA BICARB-NACL 420 G PO SOLR: 4000.0000 mL | Freq: Once | ORAL | 0 refills | Status: AC

## 2023-10-16 NOTE — Telephone Encounter (Signed)
 Pt has been scheduled with Dr.Carver on 11/11/23, instructions mailed and prep sent to pharmacy.

## 2023-10-17 ENCOUNTER — Encounter (INDEPENDENT_AMBULATORY_CARE_PROVIDER_SITE_OTHER): Payer: Self-pay | Admitting: *Deleted

## 2023-10-17 NOTE — Telephone Encounter (Signed)
 Referral completed, [facility] apt letter sent to PCP

## 2023-10-29 NOTE — Telephone Encounter (Signed)
 Spoke with pt and he doesn't want to reschedule his colonoscopy, just cancel it.   Message Received: Today Young, Elveria VEAR Ponto, Phillipa Morden L, LPN This patient LM to cancel.  I have placed in the depot, please let me know if I should cancel.  Thanks,

## 2023-11-04 DIAGNOSIS — Z681 Body mass index (BMI) 19 or less, adult: Secondary | ICD-10-CM | POA: Diagnosis not present

## 2023-11-04 DIAGNOSIS — Z9229 Personal history of other drug therapy: Secondary | ICD-10-CM | POA: Diagnosis not present

## 2023-11-04 DIAGNOSIS — E291 Testicular hypofunction: Secondary | ICD-10-CM | POA: Diagnosis not present

## 2023-11-04 DIAGNOSIS — J449 Chronic obstructive pulmonary disease, unspecified: Secondary | ICD-10-CM | POA: Diagnosis not present

## 2023-11-04 DIAGNOSIS — M5134 Other intervertebral disc degeneration, thoracic region: Secondary | ICD-10-CM | POA: Diagnosis not present

## 2023-11-04 DIAGNOSIS — M5136 Other intervertebral disc degeneration, lumbar region with discogenic back pain only: Secondary | ICD-10-CM | POA: Diagnosis not present

## 2023-11-04 DIAGNOSIS — G894 Chronic pain syndrome: Secondary | ICD-10-CM | POA: Diagnosis not present

## 2023-11-04 DIAGNOSIS — I10 Essential (primary) hypertension: Secondary | ICD-10-CM | POA: Diagnosis not present

## 2023-11-04 DIAGNOSIS — M1991 Primary osteoarthritis, unspecified site: Secondary | ICD-10-CM | POA: Diagnosis not present

## 2023-11-11 ENCOUNTER — Ambulatory Visit (HOSPITAL_COMMUNITY): Admit: 2023-11-11 | Admitting: Internal Medicine

## 2023-11-11 ENCOUNTER — Encounter (HOSPITAL_COMMUNITY): Payer: Self-pay

## 2023-11-11 SURGERY — COLONOSCOPY
Anesthesia: Choice

## 2023-12-04 ENCOUNTER — Telehealth: Payer: Self-pay | Admitting: *Deleted

## 2023-12-04 NOTE — Telephone Encounter (Signed)
 Called to remind pt of the need to have Lipid and Liver labs done. Pt states that he has plenty left and does not need a refill at this time.

## 2023-12-09 ENCOUNTER — Other Ambulatory Visit: Payer: Self-pay

## 2023-12-09 MED ORDER — ROSUVASTATIN CALCIUM 20 MG PO TABS: 20.0000 mg | ORAL_TABLET | Freq: Every day | ORAL | 2 refills | Status: AC

## 2023-12-16 ENCOUNTER — Telehealth: Payer: Self-pay | Admitting: Cardiology

## 2023-12-16 NOTE — Telephone Encounter (Signed)
 noted

## 2023-12-16 NOTE — Telephone Encounter (Signed)
 FYI Wife called and said patient just wants to cx appt with Dr Alvan on 12/18/23.  I did ask for a reason and she said she didn't know. I asked her if we prescribe him medication because not rescheduling could affect his refills and she said we don't prescribe him medications.

## 2023-12-18 ENCOUNTER — Ambulatory Visit: Admitting: Cardiology

## 2023-12-30 DIAGNOSIS — H5213 Myopia, bilateral: Secondary | ICD-10-CM | POA: Diagnosis not present

## 2024-01-19 ENCOUNTER — Other Ambulatory Visit: Payer: Self-pay | Admitting: Cardiology

## 2024-01-21 DIAGNOSIS — F5101 Primary insomnia: Secondary | ICD-10-CM | POA: Diagnosis not present

## 2024-01-21 DIAGNOSIS — Z7689 Persons encountering health services in other specified circumstances: Secondary | ICD-10-CM | POA: Diagnosis not present

## 2024-01-21 DIAGNOSIS — Z23 Encounter for immunization: Secondary | ICD-10-CM | POA: Diagnosis not present

## 2024-01-21 DIAGNOSIS — G894 Chronic pain syndrome: Secondary | ICD-10-CM | POA: Diagnosis not present

## 2024-01-21 DIAGNOSIS — J449 Chronic obstructive pulmonary disease, unspecified: Secondary | ICD-10-CM | POA: Diagnosis not present

## 2024-04-13 ENCOUNTER — Other Ambulatory Visit (HOSPITAL_COMMUNITY): Payer: Self-pay | Admitting: Nurse Practitioner

## 2024-04-13 DIAGNOSIS — I6523 Occlusion and stenosis of bilateral carotid arteries: Secondary | ICD-10-CM

## 2024-04-21 ENCOUNTER — Encounter (INDEPENDENT_AMBULATORY_CARE_PROVIDER_SITE_OTHER): Payer: Self-pay | Admitting: *Deleted

## 2024-04-21 ENCOUNTER — Other Ambulatory Visit (HOSPITAL_COMMUNITY): Payer: Self-pay | Admitting: Nurse Practitioner

## 2024-04-21 DIAGNOSIS — F17200 Nicotine dependence, unspecified, uncomplicated: Secondary | ICD-10-CM

## 2024-04-21 DIAGNOSIS — Z122 Encounter for screening for malignant neoplasm of respiratory organs: Secondary | ICD-10-CM

## 2024-07-20 ENCOUNTER — Ambulatory Visit: Admitting: Physician Assistant
